# Patient Record
Sex: Female | Born: 1990 | Race: Black or African American | Hispanic: No | Marital: Single | Smoking: Never smoker
Health system: Southern US, Community
[De-identification: ages and names within clinical notes are randomized; demographics above are authoritative.]

## PROBLEM LIST (undated history)

## (undated) DIAGNOSIS — T7840XA Allergy, unspecified, initial encounter: Secondary | ICD-10-CM

## (undated) DIAGNOSIS — J45909 Unspecified asthma, uncomplicated: Secondary | ICD-10-CM

## (undated) DIAGNOSIS — F419 Anxiety disorder, unspecified: Secondary | ICD-10-CM

## (undated) DIAGNOSIS — R569 Unspecified convulsions: Secondary | ICD-10-CM

## (undated) DIAGNOSIS — R55 Syncope and collapse: Secondary | ICD-10-CM

## (undated) HISTORY — PX: TONSILLECTOMY: SUR1361

## (undated) HISTORY — DX: Allergy, unspecified, initial encounter: T78.40XA

---
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
## 2012-04-19 ENCOUNTER — Observation Stay: Payer: Self-pay | Admitting: Obstetrics and Gynecology

## 2012-05-09 ENCOUNTER — Inpatient Hospital Stay: Payer: Self-pay | Admitting: Obstetrics & Gynecology

## 2012-05-09 LAB — CBC WITH DIFFERENTIAL/PLATELET
Basophil %: 0.5 %
Eosinophil %: 2.6 %
HCT: 39.1 % (ref 35.0–47.0)
HGB: 12.2 g/dL (ref 12.0–16.0)
Lymphocyte #: 2.4 10*3/uL (ref 1.0–3.6)
Lymphocyte %: 19.2 %
MCH: 25.9 pg — ABNORMAL LOW (ref 26.0–34.0)
MCV: 83 fL (ref 80–100)
Monocyte #: 1.3 x10 3/mm — ABNORMAL HIGH (ref 0.2–0.9)
Monocyte %: 10.9 %
Neutrophil #: 8.2 10*3/uL — ABNORMAL HIGH (ref 1.4–6.5)
RBC: 4.69 10*6/uL (ref 3.80–5.20)
RDW: 16.6 % — ABNORMAL HIGH (ref 11.5–14.5)
WBC: 12.3 10*3/uL — ABNORMAL HIGH (ref 3.6–11.0)

## 2012-05-10 LAB — HEMATOCRIT: HCT: 35.6 % (ref 35.0–47.0)

## 2013-11-04 ENCOUNTER — Emergency Department: Payer: Self-pay | Admitting: Emergency Medicine

## 2013-11-19 ENCOUNTER — Emergency Department: Payer: Self-pay | Admitting: Emergency Medicine

## 2015-01-05 NOTE — H&P (Signed)
L&D Evaluation:  History Expanded:   HPI 24 yo G1 whose EDC = 05/10/12 presents in active labor    Gravida 1    Blood Type (Maternal) O positive    Group B Strep Results Maternal (Result >5wks must be treated as unknown) positive    Maternal HIV Negative    Maternal Syphilis Ab Nonreactive    Maternal Varicella Immune    Rubella Results (Maternal) immune    Presents with contractions    Patient's Medical History No Chronic Illness    Patient's Surgical History none    Medications Pre Natal Vitamins    Allergies NKDA    Social History none   ROS:   ROS All systems were reviewed.  HEENT, CNS, GI, GU, Respiratory, CV, Renal and Musculoskeletal systems were found to be normal.   Exam:   General other    Mental Status clear    Chest clear    Heart normal sinus rhythm    Abdomen gravid, non-tender    Estimated Fetal Weight Average for gestational age    Fetal Position Vtx    Back no CVAT    Reflexes 1+    Pelvic 4 cm    FHT normal rate with no decels   Impression:   Impression active labor   Electronic Signatures: Towana Badgerosenow, Philip J (MD)  (Signed 12-Sep-13 15:16)  Authored: L&D Evaluation   Last Updated: 12-Sep-13 15:16 by Towana Badgerosenow, Philip J (MD)

## 2015-02-12 DIAGNOSIS — N898 Other specified noninflammatory disorders of vagina: Secondary | ICD-10-CM | POA: Insufficient documentation

## 2015-06-19 ENCOUNTER — Emergency Department
Admission: EM | Admit: 2015-06-19 | Discharge: 2015-06-19 | Disposition: A | Discharge status: Home / Self Care | Payer: Managed Care, Other (non HMO) | Attending: Emergency Medicine | Admitting: Emergency Medicine

## 2015-06-19 DIAGNOSIS — M791 Myalgia, unspecified site: Secondary | ICD-10-CM

## 2015-06-19 DIAGNOSIS — Y92410 Unspecified street and highway as the place of occurrence of the external cause: Secondary | ICD-10-CM | POA: Insufficient documentation

## 2015-06-19 DIAGNOSIS — Y998 Other external cause status: Secondary | ICD-10-CM | POA: Insufficient documentation

## 2015-06-19 DIAGNOSIS — Y9389 Activity, other specified: Secondary | ICD-10-CM | POA: Insufficient documentation

## 2015-06-19 DIAGNOSIS — S199XXA Unspecified injury of neck, initial encounter: Secondary | ICD-10-CM | POA: Diagnosis present

## 2015-06-19 HISTORY — DX: Unspecified asthma, uncomplicated: J45.909
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
## 2016-07-21 DIAGNOSIS — J45909 Unspecified asthma, uncomplicated: Secondary | ICD-10-CM | POA: Insufficient documentation

## 2016-07-21 DIAGNOSIS — R55 Syncope and collapse: Secondary | ICD-10-CM | POA: Insufficient documentation

## 2016-07-21 LAB — CBC
HCT: 38.1 % (ref 35.0–47.0)
Hemoglobin: 12.5 g/dL (ref 12.0–16.0)
MCH: 28.1 pg (ref 26.0–34.0)
MCHC: 32.9 g/dL (ref 32.0–36.0)
MCV: 85.4 fL (ref 80.0–100.0)
PLATELETS: 193 10*3/uL (ref 150–440)
RBC: 4.46 MIL/uL (ref 3.80–5.20)
RDW: 14.6 % — ABNORMAL HIGH (ref 11.5–14.5)
WBC: 8.1 10*3/uL (ref 3.6–11.0)

## 2016-07-21 LAB — POCT PREGNANCY, URINE: Preg Test, Ur: NEGATIVE

## 2016-07-21 LAB — GLUCOSE, CAPILLARY: GLUCOSE-CAPILLARY: 83 mg/dL (ref 65–99)
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
## 2016-07-22 ENCOUNTER — Emergency Department
Admission: EM | Admit: 2016-07-22 | Discharge: 2016-07-22 | Disposition: A | Discharge status: Home / Self Care | Payer: Managed Care, Other (non HMO) | Attending: Emergency Medicine | Admitting: Emergency Medicine

## 2016-07-22 ENCOUNTER — Encounter: Payer: Self-pay | Admitting: Emergency Medicine

## 2016-07-22 DIAGNOSIS — R55 Syncope and collapse: Secondary | ICD-10-CM

## 2016-07-22 HISTORY — DX: Syncope and collapse: R55

## 2016-07-22 LAB — BASIC METABOLIC PANEL
Anion gap: 7 (ref 5–15)
BUN: 14 mg/dL (ref 6–20)
CHLORIDE: 107 mmol/L (ref 101–111)
CO2: 25 mmol/L (ref 22–32)
CREATININE: 0.7 mg/dL (ref 0.44–1.00)
Calcium: 9.4 mg/dL (ref 8.9–10.3)
GFR calc Af Amer: >60 mL/min (ref >60)
GFR calc non Af Amer: >60 mL/min (ref >60)
GLUCOSE: 92 mg/dL (ref 65–99)
POTASSIUM: 4.1 mmol/L (ref 3.5–5.1)
Sodium: 139 mmol/L (ref 135–145)

## 2016-07-22 LAB — URINALYSIS COMPLETE WITH MICROSCOPIC (ARMC ONLY)
BACTERIA UA: NONE SEEN
Bilirubin Urine: NEGATIVE
GLUCOSE, UA: NEGATIVE mg/dL
Nitrite: NEGATIVE
PROTEIN: 30 mg/dL — AB
Specific Gravity, Urine: 1.031 — ABNORMAL HIGH (ref 1.005–1.030)
pH: 5 (ref 5.0–8.0)

## 2016-07-22 MED ORDER — SODIUM CHLORIDE 0.9 % IV BOLUS (SEPSIS)
1000.0000 mL | INTRAVENOUS | Status: AC
  Administered 2016-07-22: 1000 mL via INTRAVENOUS

## 2016-07-22 NOTE — Discharge Instructions (Signed)
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
## 2016-07-24 ENCOUNTER — Telehealth: Payer: Self-pay

## 2016-07-24 NOTE — Telephone Encounter (Signed)
Lmov for patient to call back and schedule appointment for patient was seen in ED

## 2016-07-26 NOTE — Telephone Encounter (Signed)
Spoke to patient she is coming on 08/02/16

## 2016-08-02 ENCOUNTER — Other Ambulatory Visit: Payer: Self-pay

## 2016-08-02 ENCOUNTER — Ambulatory Visit (INDEPENDENT_AMBULATORY_CARE_PROVIDER_SITE_OTHER): Payer: Managed Care, Other (non HMO)

## 2016-08-02 ENCOUNTER — Encounter: Payer: Self-pay | Admitting: Internal Medicine

## 2016-08-02 ENCOUNTER — Other Ambulatory Visit (INDEPENDENT_AMBULATORY_CARE_PROVIDER_SITE_OTHER): Payer: Managed Care, Other (non HMO)

## 2016-08-02 ENCOUNTER — Other Ambulatory Visit: Payer: Self-pay | Admitting: Internal Medicine

## 2016-08-02 ENCOUNTER — Ambulatory Visit (INDEPENDENT_AMBULATORY_CARE_PROVIDER_SITE_OTHER): Payer: Managed Care, Other (non HMO) | Admitting: Internal Medicine

## 2016-08-02 VITALS — BP 105/68 | HR 71 | Ht 66.0 in | Wt 118.5 lb

## 2016-08-02 DIAGNOSIS — R55 Syncope and collapse: Secondary | ICD-10-CM

## 2016-08-02 DIAGNOSIS — R0602 Shortness of breath: Secondary | ICD-10-CM | POA: Diagnosis not present

## 2016-08-02 DIAGNOSIS — R002 Palpitations: Secondary | ICD-10-CM

## 2016-08-02 DIAGNOSIS — R079 Chest pain, unspecified: Secondary | ICD-10-CM

## 2016-08-02 LAB — ECHOCARDIOGRAM COMPLETE
CHL CUP STROKE VOLUME: 53 mL
E decel time: 264 msec
EERAT: 4.31
FS: 38 % (ref 28–44)
Height: 66 in
IVS/LV PW RATIO, ED: 0.84
LA ID, A-P, ES: 29 mm
LA vol index: 18.5 mL/m2
LA vol: 29.6 mL
LADIAMINDEX: 1.81 cm/m2
LAVOLA4C: 20 mL
LEFT ATRIUM END SYS DIAM: 29 mm
LV E/e'average: 4.31
LV PW d: 8.11 mm — AB (ref 0.6–1.1)
LV SIMPSON'S DISK: 76
LV TDI E'LATERAL: 19.3
LV dias vol index: 44 mL/m2
LV dias vol: 71 mL (ref 46–106)
LV sys vol index: 11 mL/m2
LVEEMED: 4.31
LVELAT: 19.3 cm/s
LVOT SV: 55 mL
LVOT VTI: 19.2 cm
LVOT area: 2.84 cm2
LVOT diameter: 19 mm
LVOT peak vel: 104 cm/s
LVSYSVOL: 17 mL (ref 14–42)
MV Dec: 264
MVPG: 3 mmHg
MVPKAVEL: 55.7 m/s
MVPKEVEL: 83.2 m/s
PV Reg vel dias: 78.3 cm/s
RV LATERAL S' VELOCITY: 13.1 cm/s
TAPSE: 24.9 mm
TDI e' medial: 12.1
Weight: 1896 oz

## 2016-08-02 NOTE — Progress Notes (Signed)
New Outpatient Visit Date: 08/02/2016  Referring Provider: Loleta Rose, MD [facility] Emergency Department  Chief Complaint: Passing out  HPI:  Ms. Warning is a 25 y.o. year-old female with history of asthma, who has been referred by Dr. York Cerise for evaluation of syncope. The patient reports multiple episodes of syncope as far back as middle school. These typically occur when standing for extended periods of time, though as a child, she also had several episodes while exerting herself. She frequently experiences a sharp pain in her chest followed by racing of the heart and shortness of breath. She states that it is painful and difficult to take a deep breath. After few minutes, she feels as though she is about to pass out. Frequently, she is able to lie down and abort the episode without losing consciousness. After 10-15 minutes, she feels normal again. Ms. Olivos experiences the palpitations and chest discomfort several times a week, but has only passed out completely 3 times over the last year.  Two weeks ago (Thanksgiving night), the patient also blacked out while out with friends. She was at a club and had consumed a single alcoholic beverage. Shortly after dancing, she was standing against a wall and unexpectedly fell over with brief loss of consciousness. She did not experience her typical prodromal symptoms. She believes that she came to after a few minutes and was helped outside. She was very sore on her left side, where she landed. She went home and slept well that night but continued to feel fatigued the following day with an accompanying headache. She was concerned that she may have struck her head and had a concussion. She proceeded to the [facility] ED, where evaluation was unremarkable. She has not had any further syncopal episodes since that time.  The patient denies a history of prior cardiovascular evaluation. She states that she stopped breathing while asleep as a child and was briefly on some  sort of breathing apparatus (? CPAP). The patient also reports that after some of her more recent syncopal episodes, she has experienced cramps in her hands and feet lasting a few minutes. She denies claudication, leg edema, orthopnea, and PND.  The patient does not drink alcohol regularly. She drinks 1 to 2 sodas per day, sometimes caffeinated. In the past, she has had considerable fluttering in her chest when drinking caffeinated energy drinks. She does not consume these regularly anymore.  --------------------------------------------------------------------------------------------------  Cardiovascular History & Procedures: Cardiovascular Problems:  Syncope  Risk Factors:  None  Cath/PCI:  None  CV Surgery:  None  EP Procedures and Devices:  None  Non-Invasive Evaluation(s):  None  Recent CV Pertinent Labs: Lab Results  Component Value Date   K 4.1 07/21/2016   BUN 14 07/21/2016   CREATININE 0.70 07/21/2016    --------------------------------------------------------------------------------------------------  Past Medical History:  Diagnosis Date  . Asthma   . Syncope and collapse     Past Surgical History:  Procedure Laterality Date  . TONSILLECTOMY      No outpatient encounter prescriptions on file as of 08/02/2016.   No facility-administered encounter medications on file as of 08/02/2016.     Allergies: Dust mite extract  Social History   Social History  . Marital status: Single    Spouse name: N/A  . Number of children: N/A  . Years of education: N/A   Occupational History  . Not on file.   Social History Main Topics  . Smoking status: Never Smoker  . Smokeless tobacco: Never Used  .  Alcohol use 0.6 oz/week    1 Shots of liquor per week     Comment: 3 mixed drinks per month  . Drug use: No  . Sexual activity: Not on file   Other Topics Concern  . Not on file   Social History Narrative  . No narrative on file    Family History    Problem Relation Age of Onset  . Anxiety disorder Mother   . Seizures Cousin   . Sudden Cardiac Death Neg Hx     Review of Systems: A 12-system review of systems was performed and was negative except as noted in the HPI.  --------------------------------------------------------------------------------------------------  Physical Exam: BP 105/68 (BP Location: Right Arm, Patient Position: Sitting, Cuff Size: Normal)   Pulse 71   Ht 5\' 6"  (1.676 m)   Wt 118 lb 8 oz (53.8 kg)   BMI 19.13 kg/m    Position Blood pressure (mmHg) Heart rate (bpm)  Lying 101/65 61  Sitting 99/68 64  Standing 90/62 79  Standing (3 minutes) 98/66 80   General:  Slender young woman, seated comfortably on the exam table. She is accompanied by her boyfriend. HEENT: No conjunctival pallor or scleral icterus.  Moist mucous membranes.  OP clear. Neck: Supple without lymphadenopathy, thyromegaly, JVD, or HJR.  No carotid bruit. Lungs: Normal work of breathing.  Clear to auscultation bilaterally without wheezes or crackles. Heart: Regular rate and rhythm without murmurs, rubs, or gallops.  Non-displaced PMI. Abd: Bowel sounds present.  Soft, NT/ND without hepatosplenomegaly Ext: No lower extremity edema.  Radial, PT, and DP pulses are 2+ bilaterally Skin: warm and dry without rash Neuro: CNIII-XII intact.  Strength and fine-touch sensation intact in upper and lower extremities bilaterally. Psych: Normal mood and affect.  EKG:  Normal sinus rhythm without significant abnormalities. Compared with prior tracing from 07/21/16, R-wave progression is now normal in the precordial leads (suspect prior EKG findings were due to lead placement). I have personally reviewed both tracings.  Lab Results  Component Value Date   WBC 8.1 07/21/2016   HGB 12.5 07/21/2016   HCT 38.1 07/21/2016   MCV 85.4 07/21/2016   PLT 193 07/21/2016    Lab Results  Component Value Date   NA 139 07/21/2016   K 4.1 07/21/2016   CL 107  07/21/2016   CO2 25 07/21/2016   BUN 14 07/21/2016   CREATININE 0.70 07/21/2016   GLUCOSE 92 07/21/2016   Pregnancy test (07/21/16): negative  --------------------------------------------------------------------------------------------------  ASSESSMENT AND PLAN: Recurrent syncope with accompanying palpitations, chest pain, and shortness of breath Many of the patient's episodes are most suggestive of a vasovagal mechanism or postural orthostatic tachycardia syndrome (POTS). However, her most recent episode that occurred without prodromal symptoms is somewhat concerning for an alternate etiology, as is the history of exercise induced dizziness/syncope. The patient otherwise does not have risk factors for cardiovascular disease, including no family history of unexplained syncope or death. Her EKG and physical exam today are normal. We have agreed to obtain a transthoracic echocardiogram and 14-day event monitor to evaluate for structural heart disease and further characterize potential arrhythmias. We will check a magnesium level and TSH as well. If this workup is negative, we will likely need to proceed with an exercise tolerance stress test. In the meantime, I have encouraged the patient to increase her fluid and salt intake, as well as to consider wearing compression stockings while at work. The patient was also counseled to avoid alcohol and caffeine.  Follow-up: Return  to clinic in 6 weeks.  Yvonne Kendallhristopher Toren Tucholski, MD 08/02/2016 1:16 PM

## 2016-08-02 NOTE — Patient Instructions (Addendum)
Medication Instructions:  Your physician recommends that you continue on your current medications as directed. Please refer to the Current Medication list given to you today.   Labwork: none  Testing/Procedures: Your physician has requested that you have an echocardiogram. Echocardiography is a painless test that uses sound waves to create images of your heart. It provides your doctor with information about the size and shape of your heart and how well your heart's chambers and valves are working. This procedure takes approximately one hour. There are no restrictions for this procedure.  Your physician has recommended that you wear an event monitor. Event monitors are medical devices that record the heart's electrical activity. Doctors most often us these monitors to diagnose arrhythmias. Arrhythmias are problems with the speed or rhythm of the heartbeat. The monitor is a small, portable device. You can wear one while you do your normal daily activities. This is usually used to diagnose what is causing palpitations/syncope (passing out).    Follow-Up: Your physician recommends that you schedule a follow-up appointment in: 6 weeks with Dr. Okey DupreEnd.    Any Other Special Instructions Will Be Listed Below (If Applicable).     If you need a refill on your cardiac medications before your next appointment, please call your pharmacy.  Echocardiogram An echocardiogram, or echocardiography, uses sound waves (ultrasound) to produce an image of your heart. The echocardiogram is simple, painless, obtained within a short period of time, and offers valuable information to your health care provider. The images from an echocardiogram can provide information such as:  Evidence of coronary artery disease (CAD).  Heart size.  Heart muscle function.  Heart valve function.  Aneurysm detection.  Evidence of a past heart attack.  Fluid buildup around the heart.  Heart muscle thickening.  Assess heart  valve function. Tell a health care provider about:  Any allergies you have.  All medicines you are taking, including vitamins, herbs, eye drops, creams, and over-the-counter medicines.  Any problems you or family members have had with anesthetic medicines.  Any blood disorders you have.  Any surgeries you have had.  Any medical conditions you have.  Whether you are pregnant or may be pregnant. What happens before the procedure? No special preparation is needed. Eat and drink normally. What happens during the procedure?  In order to produce an image of your heart, gel will be applied to your chest and a wand-like tool (transducer) will be moved over your chest. The gel will help transmit the sound waves from the transducer. The sound waves will harmlessly bounce off your heart to allow the heart images to be captured in real-time motion. These images will then be recorded.  You may need an IV to receive a medicine that improves the quality of the pictures. What happens after the procedure? You may return to your normal schedule including diet, activities, and medicines, unless your health care provider tells you otherwise. This information is not intended to replace advice given to you by your health care provider. Make sure you discuss any questions you have with your health care provider. Document Released: 08/11/2000 Document Revised: 04/01/2016 Document Reviewed: 04/21/2013 Elsevier Interactive Patient Education  2017 Elsevier Inc.  Cardiac Event Monitoring A cardiac event monitor is a small recording device used to help detect abnormal heart rhythms (arrhythmias). The monitor is used to record heart rhythm when noticeable symptoms such as the following occur:  Fast heartbeats (palpitations), such as heart racing or fluttering.  Dizziness.  Fainting or light-headedness.  Unexplained weakness. The monitor is wired to two electrodes placed on your chest. Electrodes are flat,  sticky disks that attach to your skin. The monitor can be worn for up to 30 days. You will wear the monitor at all times, except when bathing. How to use your cardiac event monitor A technician will prepare your chest for the electrode placement. The technician will show you how to place the electrodes, how to work the monitor, and how to replace the batteries. Take time to practice using the monitor before you leave the office. Make sure you understand how to send the information from the monitor to your health care provider. This requires a telephone with a landline, not a cell phone. You need to:  Wear your monitor at all times, except when you are in water:  Do not get the monitor wet.  Take the monitor off when bathing. Do not swim or use a hot tub with it on.  Keep your skin clean. Do not put body lotion or moisturizer on your chest.  Change the electrodes daily or any time they stop sticking to your skin. You might need to use tape to keep them on.  It is possible that your skin under the electrodes could become irritated. To keep this from happening, try to put the electrodes in slightly different places on your chest. However, they must remain in the area under your left breast and in the upper right section of your chest.  Make sure the monitor is safely clipped to your clothing or in a location close to your body that your health care provider recommends.  Press the button to record when you feel symptoms of heart trouble, such as dizziness, weakness, light-headedness, palpitations, thumping, shortness of breath, unexplained weakness, or a fluttering or racing heart. The monitor is always on and records what happened slightly before you pressed the button, so do not worry about being too late to get good information.  Keep a diary of your activities, such as walking, doing chores, and taking medicine. It is especially important to note what you were doing when you pushed the button to  record your symptoms. This will help your health care provider determine what might be contributing to your symptoms. The information stored in your monitor will be reviewed by your health care provider alongside your diary entries.  Send the recorded information as recommended by your health care provider. It is important to understand that it will take some time for your health care provider to process the results.  Change the batteries as recommended by your health care provider. Get help right away if:  You have chest pain.  You have extreme difficulty breathing or shortness of breath.  You develop a very fast heartbeat that persists.  You develop dizziness that does not go away.  You faint or constantly feel you are about to faint. This information is not intended to replace advice given to you by your health care provider. Make sure you discuss any questions you have with your health care provider. Document Released: 05/23/2008 Document Revised: 01/20/2016 Document Reviewed: 02/10/2013 Elsevier Interactive Patient Education  2017 [employer]

## 2016-08-04 ENCOUNTER — Ambulatory Visit (INDEPENDENT_AMBULATORY_CARE_PROVIDER_SITE_OTHER): Payer: Managed Care, Other (non HMO)

## 2016-08-04 ENCOUNTER — Other Ambulatory Visit: Payer: Self-pay

## 2016-08-04 DIAGNOSIS — R55 Syncope and collapse: Secondary | ICD-10-CM | POA: Diagnosis not present

## 2016-08-04 DIAGNOSIS — R002 Palpitations: Secondary | ICD-10-CM | POA: Diagnosis not present

## 2016-08-05 LAB — TSH: TSH: 0.423 u[IU]/mL — AB (ref 0.450–4.500)

## 2016-08-05 LAB — SPECIMEN STATUS REPORT

## 2016-08-07 ENCOUNTER — Other Ambulatory Visit: Payer: Self-pay

## 2016-08-07 ENCOUNTER — Telehealth: Payer: Self-pay | Admitting: Internal Medicine

## 2016-08-07 DIAGNOSIS — R002 Palpitations: Secondary | ICD-10-CM

## 2016-08-07 NOTE — Telephone Encounter (Signed)
Pt came into the office Friday, Dec 8, stating Zio Patch had come off.  New Zio was placed and pt instructed to mail first one back today. New information entered into [employer] and faxed to [phone] so that pt will not get charged twice.

## 2016-08-08 LAB — MAGNESIUM: MAGNESIUM: 1.9 mg/dL (ref 1.6–2.3)

## 2016-08-08 LAB — SPECIMEN STATUS REPORT

## 2016-08-09 ENCOUNTER — Encounter: Payer: Self-pay | Admitting: *Deleted

## 2016-08-19 LAB — MAGNESIUM: MAGNESIUM: 2.2 mg/dL (ref 1.6–2.3)

## 2016-08-19 LAB — SPECIMEN STATUS REPORT

## 2016-08-25 ENCOUNTER — Encounter: Payer: Self-pay | Admitting: *Deleted

## 2016-08-29 ENCOUNTER — Other Ambulatory Visit: Payer: Self-pay

## 2016-08-29 DIAGNOSIS — R002 Palpitations: Secondary | ICD-10-CM

## 2016-09-04 ENCOUNTER — Encounter: Payer: Self-pay | Admitting: *Deleted

## 2016-09-04 ENCOUNTER — Telehealth: Payer: Self-pay | Admitting: *Deleted

## 2016-09-04 NOTE — Telephone Encounter (Signed)
Patient did not show for exercise tolerance test today. No answer. Left message for patient to call back to reschedule.

## 2016-09-12 ENCOUNTER — Ambulatory Visit: Payer: Managed Care, Other (non HMO) | Admitting: Internal Medicine

## 2016-09-13 ENCOUNTER — Ambulatory Visit: Payer: Managed Care, Other (non HMO) | Admitting: Internal Medicine

## 2016-09-27 ENCOUNTER — Encounter: Payer: Self-pay | Admitting: Internal Medicine

## 2016-12-22 ENCOUNTER — Emergency Department
Admission: EM | Admit: 2016-12-22 | Discharge: 2016-12-23 | Disposition: A | Discharge status: Home / Self Care | Payer: Managed Care, Other (non HMO) | Attending: Emergency Medicine | Admitting: Emergency Medicine

## 2016-12-22 ENCOUNTER — Encounter: Payer: Self-pay | Admitting: Emergency Medicine

## 2016-12-22 DIAGNOSIS — F419 Anxiety disorder, unspecified: Secondary | ICD-10-CM | POA: Insufficient documentation

## 2016-12-22 DIAGNOSIS — F410 Panic disorder [episodic paroxysmal anxiety] without agoraphobia: Secondary | ICD-10-CM

## 2016-12-22 DIAGNOSIS — J45909 Unspecified asthma, uncomplicated: Secondary | ICD-10-CM | POA: Insufficient documentation

## 2016-12-22 DIAGNOSIS — R55 Syncope and collapse: Secondary | ICD-10-CM

## 2016-12-22 HISTORY — DX: Anxiety disorder, unspecified: F41.9

## 2016-12-22 NOTE — ED Notes (Signed)
Pt. States while at work tonight talking to a fellow co-worker, pt. States she started to feel hot and her heart rate was going up.  Pt. States she has had in the past and they usually go away in five minutes.  Pt. States tonight she might have had a syncope episode.  Pt. States HA and hurting at the back of her head.  No trauma noted to back of pt. Head.

## 2016-12-22 NOTE — ED Provider Notes (Signed)
[facility] Emergency Department Provider Note   ____________________________________________   First MD Initiated Contact with Patient 12/22/16 2332     (approximate)  I have reviewed the triage vital signs and the nursing notes.   HISTORY  Chief Complaint Anxiety    HPI Denise Lopez is a 26 y.o. female patient says she's had some panic attacks it was very small before felt hot but today she felt much hotter while talking to a coworker and then blacked out and woke up on the floor. She was coming into the emergency room she developed a headache posteriorly which is now fairly bad. This is unusual for her. Review of the patient's records show that she has a history of syncope in the past and has been evaluated by cardiology as well. Except for the headache patient feels well now she does not have chest pain she did not have any chest pain or palpitations that she remembers. She does not think she injured herself when she fell.   Past Medical History:  Diagnosis Date  . Anxiety   . Asthma   . Syncope and collapse     There are no active problems to display for this patient.   Past Surgical History:  Procedure Laterality Date  . TONSILLECTOMY      Prior to Admission medications   Not on File    Allergies Dust mite extract  Family History  Problem Relation Age of Onset  . Anxiety disorder Mother   . Seizures Cousin   . Sudden Cardiac Death Neg Hx     Social History Social History  Substance Use Topics  . Smoking status: Never Smoker  . Smokeless tobacco: Never Used  . Alcohol use 0.6 oz/week    1 Shots of liquor per week     Comment: 3 mixed drinks per month    Review of Systems  Constitutional: No fever/chills Eyes: No visual changes. ENT: No sore throat. Cardiovascular: Denies chest pain. Respiratory: Denies shortness of breath. Gastrointestinal: No abdominal pain.  No nausea, no vomiting.  No diarrhea.  No  constipation. Genitourinary: Negative for dysuria. Musculoskeletal: Negative for back pain. Skin: Negative for rash. Neurological: Negative for focal weakness or numbness.   ____________________________________________   PHYSICAL EXAM:  VITAL SIGNS: ED Triage Vitals  Enc Vitals Group     BP 12/22/16 2313 106/61     Pulse Rate 12/22/16 2313 82     Resp 12/22/16 2313 16     Temp 12/22/16 2313 97.6 F (36.4 C)     Temp Source 12/22/16 2313 Oral     SpO2 12/22/16 2313 100 %     Weight 12/22/16 2314 115 lb (52.2 kg)     Height 12/22/16 2314  (1.702 m)     Head Circumference --      Peak Flow --      Pain Score 12/22/16 2313 10     Pain Loc --      Pain Edu? --      Excl. in GC? --     Constitutional: Alert and oriented. Well appearing and in no acute distress. Eyes: Conjunctivae are normal. PERRL. EOMI. Head: Atraumatic. Nose: No congestion/rhinnorhea. Mouth/Throat: Mucous membranes are moist.  Oropharynx non-erythematous. Neck: No stridor.   Cardiovascular: Normal rate, regular rhythm. Grossly normal heart sounds.  Good peripheral circulation. Respiratory: Normal respiratory effort.  No retractions. Lungs CTAB. Gastrointestinal: Soft and nontender. No distention. No abdominal bruits. No CVA tenderness. }Musculoskeletal: No lower  extremity tenderness nor edema.  No joint effusions. Neurologic:  Normal speech and language. No gross focal neurologic deficits are appreciated.Cranial nerves II through XII are intact except for that visual fields were not checked cerebellar rapid alternating movements and finger to nose are normal motor strength is 5 over 5 throughout sensation is intact throughout. No gait instability. Skin:  Skin is warm, dry and intact. No rash noted. Psychiatric: Mood and affect are normal. Speech and behavior are normal.  ____________________________________________   LABS (all labs ordered are listed, but only abnormal results are displayed)  Labs  Reviewed  CBC WITH DIFFERENTIAL/PLATELET - Abnormal; Notable for the following:       Result Value   RDW 15.0 (*)    All other components within normal limits  URINALYSIS, COMPLETE (UACMP) WITH MICROSCOPIC - Abnormal; Notable for the following:    Color, Urine YELLOW (*)    APPearance CLEAR (*)    Leukocytes, UA TRACE (*)    Squamous Epithelial / LPF 0-5 (*)    All other components within normal limits  COMPREHENSIVE METABOLIC PANEL  TROPONIN I  PREGNANCY, URINE   ____________________________________________  EKG   ____________________________________________  RADIOLOGY  Study Result   CLINICAL DATA:  Syncope and headache at work. No head trauma. Headache and posterior head pain.  EXAM: CT HEAD WITHOUT CONTRAST  TECHNIQUE: Contiguous axial images were obtained from the base of the skull through the vertex without intravenous contrast.  COMPARISON:  None.  FINDINGS: BRAIN: No intraparenchymal hemorrhage, mass effect nor midline shift. The ventricles and sulci are normal. No acute large vascular territory infarcts. No abnormal extra-axial fluid collections. Basal cisterns are patent.  VASCULAR: Unremarkable.  SKULL/SOFT TISSUES: No skull fracture. No significant soft tissue swelling.  ORBITS/SINUSES: The included ocular globes and orbital contents are normal.The mastoid aircells and included paranasal sinuses are well-aerated. Pneumatized petrous apices.  OTHER: None.  IMPRESSION: Normal noncontrast CT HEAD.   Electronically Signed   By: Awilda Metro M.D.   On: 12/23/2016 01:57     ____________________________________________   PROCEDURES  Procedure(s) performed:  Procedures  Critical Care performed:   ____________________________________________   INITIAL IMPRESSION / ASSESSMENT AND PLAN / ED COURSE  Pertinent labs & imaging results that were available during my care of the patient were reviewed by me and considered in  my medical decision making (see chart for details).        ____________________________________________   FINAL CLINICAL IMPRESSION(S) / ED DIAGNOSES  Final diagnoses:  Anxiety attack  Syncope, unspecified syncope type      NEW MEDICATIONS STARTED DURING THIS VISIT:  New Prescriptions   No medications on file     Note:  This document was prepared using Dragon voice recognition software and may include unintentional dictation errors.    Arnaldo Natal, MD 12/23/16 [id number]

## 2016-12-22 NOTE — ED Triage Notes (Signed)
Pt. States "while at work tonight pt. States she experienced an anxiety attack while talking to another co-worker tonight.  Pt. Unsure how it started, Pt. States "I have had mini-anxiety attacks in the past, but this one lasted longer".

## 2016-12-23 ENCOUNTER — Emergency Department: Payer: Managed Care, Other (non HMO)

## 2016-12-23 LAB — CBC WITH DIFFERENTIAL/PLATELET
BASOS PCT: 1 %
Basophils Absolute: 0.1 10*3/uL (ref 0–0.1)
EOS ABS: 0.1 10*3/uL (ref 0–0.7)
Eosinophils Relative: 1 %
HEMATOCRIT: 39.9 % (ref 35.0–47.0)
Hemoglobin: 13 g/dL (ref 12.0–16.0)
Lymphocytes Relative: 22 %
Lymphs Abs: 2 10*3/uL (ref 1.0–3.6)
MCH: 27.7 pg (ref 26.0–34.0)
MCHC: 32.5 g/dL (ref 32.0–36.0)
MCV: 85.3 fL (ref 80.0–100.0)
MONO ABS: 0.8 10*3/uL (ref 0.2–0.9)
MONOS PCT: 9 %
NEUTROS ABS: 6 10*3/uL (ref 1.4–6.5)
Neutrophils Relative %: 67 %
Platelets: 216 10*3/uL (ref 150–440)
RBC: 4.68 MIL/uL (ref 3.80–5.20)
RDW: 15 % — ABNORMAL HIGH (ref 11.5–14.5)
WBC: 8.8 10*3/uL (ref 3.6–11.0)

## 2016-12-23 LAB — URINALYSIS, COMPLETE (UACMP) WITH MICROSCOPIC
BILIRUBIN URINE: NEGATIVE
Bacteria, UA: NONE SEEN
GLUCOSE, UA: NEGATIVE mg/dL
HGB URINE DIPSTICK: NEGATIVE
Ketones, ur: NEGATIVE mg/dL
Nitrite: NEGATIVE
PH: 6 (ref 5.0–8.0)
PROTEIN: NEGATIVE mg/dL
Specific Gravity, Urine: 1.021 (ref 1.005–1.030)

## 2016-12-23 LAB — COMPREHENSIVE METABOLIC PANEL
ALBUMIN: 4.3 g/dL (ref 3.5–5.0)
ALK PHOS: 48 U/L (ref 38–126)
ALT: 37 U/L (ref 14–54)
AST: 31 U/L (ref 15–41)
Anion gap: 7 (ref 5–15)
BILIRUBIN TOTAL: 1 mg/dL (ref 0.3–1.2)
BUN: 14 mg/dL (ref 6–20)
CALCIUM: 9.2 mg/dL (ref 8.9–10.3)
CO2: 25 mmol/L (ref 22–32)
CREATININE: 0.62 mg/dL (ref 0.44–1.00)
Chloride: 103 mmol/L (ref 101–111)
GFR calc Af Amer: >60 mL/min (ref >60)
GFR calc non Af Amer: >60 mL/min (ref >60)
GLUCOSE: 82 mg/dL (ref 65–99)
Potassium: 3.8 mmol/L (ref 3.5–5.1)
Sodium: 135 mmol/L (ref 135–145)
TOTAL PROTEIN: 7.3 g/dL (ref 6.5–8.1)

## 2016-12-23 LAB — PREGNANCY, URINE: Preg Test, Ur: NEGATIVE

## 2016-12-23 LAB — TROPONIN I: Troponin I: <0.03 ng/mL (ref <0.03)

## 2016-12-23 MED ORDER — ACETAMINOPHEN 500 MG PO TABS
1000.0000 mg | ORAL_TABLET | Freq: Once | ORAL | Status: AC
  Administered 2016-12-23: 1000 mg via ORAL

## 2016-12-23 MED ORDER — ACETAMINOPHEN 500 MG PO TABS
ORAL_TABLET | ORAL | Status: AC
Start: 2016-12-23 — End: 2016-12-23
  Administered 2016-12-23: 1000 mg via ORAL
  Filled 2016-12-23: qty 2

## 2016-12-23 NOTE — ED Notes (Signed)
Patient transported to CT 

## 2016-12-23 NOTE — ED Notes (Signed)
Pt. Going home with family. 

## 2016-12-23 NOTE — ED Notes (Signed)
Pt. Returned from CT.

## 2016-12-23 NOTE — Discharge Instructions (Signed)
Please follow-up with your regular doctor or see [facility] clinic acute-care for further treatment and evaluation of your anxiety. Please return for any further problems.

## 2017-04-26 ENCOUNTER — Encounter: Payer: Self-pay | Admitting: Emergency Medicine

## 2017-04-26 ENCOUNTER — Emergency Department
Admission: EM | Admit: 2017-04-26 | Discharge: 2017-04-26 | Disposition: A | Discharge status: Home / Self Care | Payer: Commercial Managed Care - PPO | Attending: Emergency Medicine | Admitting: Emergency Medicine

## 2017-04-26 ENCOUNTER — Emergency Department: Payer: Commercial Managed Care - PPO

## 2017-04-26 DIAGNOSIS — B9689 Other specified bacterial agents as the cause of diseases classified elsewhere: Secondary | ICD-10-CM

## 2017-04-26 DIAGNOSIS — N760 Acute vaginitis: Secondary | ICD-10-CM | POA: Diagnosis not present

## 2017-04-26 DIAGNOSIS — N939 Abnormal uterine and vaginal bleeding, unspecified: Secondary | ICD-10-CM | POA: Diagnosis not present

## 2017-04-26 DIAGNOSIS — J45909 Unspecified asthma, uncomplicated: Secondary | ICD-10-CM | POA: Insufficient documentation

## 2017-04-26 DIAGNOSIS — R58 Hemorrhage, not elsewhere classified: Secondary | ICD-10-CM

## 2017-04-26 LAB — WET PREP, GENITAL
Sperm: NONE SEEN
Trich, Wet Prep: NONE SEEN
YEAST WET PREP: NONE SEEN

## 2017-04-26 LAB — COMPREHENSIVE METABOLIC PANEL
ALBUMIN: 4 g/dL (ref 3.5–5.0)
ALT: 18 U/L (ref 14–54)
ANION GAP: 5 (ref 5–15)
AST: 19 U/L (ref 15–41)
Alkaline Phosphatase: 41 U/L (ref 38–126)
BUN: 15 mg/dL (ref 6–20)
CHLORIDE: 108 mmol/L (ref 101–111)
CO2: 25 mmol/L (ref 22–32)
Calcium: 8.9 mg/dL (ref 8.9–10.3)
Creatinine, Ser: 0.76 mg/dL (ref 0.44–1.00)
GFR calc Af Amer: >60 mL/min (ref >60)
Glucose, Bld: 98 mg/dL (ref 65–99)
POTASSIUM: 3.9 mmol/L (ref 3.5–5.1)
Sodium: 138 mmol/L (ref 135–145)
Total Bilirubin: 0.6 mg/dL (ref 0.3–1.2)
Total Protein: 7.1 g/dL (ref 6.5–8.1)

## 2017-04-26 LAB — POCT PREGNANCY, URINE: PREG TEST UR: NEGATIVE

## 2017-04-26 LAB — CHLAMYDIA/NGC RT PCR (ARMC ONLY)
Chlamydia Tr: NOT DETECTED
N GONORRHOEAE: NOT DETECTED

## 2017-04-26 LAB — CBC WITH DIFFERENTIAL/PLATELET
Basophils Absolute: 0.1 10*3/uL (ref 0–0.1)
Basophils Relative: 1 %
EOS PCT: 2 %
Eosinophils Absolute: 0.1 10*3/uL (ref 0–0.7)
HEMATOCRIT: 35.9 % (ref 35.0–47.0)
Hemoglobin: 11.8 g/dL — ABNORMAL LOW (ref 12.0–16.0)
LYMPHS ABS: 2.6 10*3/uL (ref 1.0–3.6)
LYMPHS PCT: 37 %
MCH: 27.8 pg (ref 26.0–34.0)
MCHC: 32.9 g/dL (ref 32.0–36.0)
MCV: 84.4 fL (ref 80.0–100.0)
MONO ABS: 0.6 10*3/uL (ref 0.2–0.9)
Monocytes Relative: 9 %
Neutro Abs: 3.5 10*3/uL (ref 1.4–6.5)
Neutrophils Relative %: 51 %
PLATELETS: 206 10*3/uL (ref 150–440)
RBC: 4.26 MIL/uL (ref 3.80–5.20)
RDW: 15.4 % — AB (ref 11.5–14.5)
WBC: 6.9 10*3/uL (ref 3.6–11.0)

## 2017-04-26 LAB — TYPE AND SCREEN
ABO/RH(D): O POS
ANTIBODY SCREEN: NEGATIVE

## 2017-04-26 LAB — PREGNANCY, URINE: PREG TEST UR: NEGATIVE

## 2017-04-26 MED ORDER — METRONIDAZOLE 500 MG PO TABS: 500.0000 mg | ORAL_TABLET | Freq: Two times a day (BID) | ORAL | 0 refills | Status: AC

## 2017-04-26 NOTE — ED Provider Notes (Signed)
[facility] Emergency Department Provider Note   ____________________________________________   First MD Initiated Contact with Patient 04/26/17 0710     (approximate)  I have reviewed the triage vital signs and the nursing notes.   HISTORY  Chief Complaint Vaginal Bleeding   HPI Denise Lopez is a 26 y.o. female he reports daily bleeding for 2 months. Sometimes very heavy although today it was not it was light today and she does not complain of any lightheadedness or weakness just the bleeding. She also reports a bad odor.patient does not have any pain.   Past Medical History:  Diagnosis Date  . Anxiety   . Asthma   . Syncope and collapse     There are no active problems to display for this patient.   Past Surgical History:  Procedure Laterality Date  . TONSILLECTOMY      Prior to Admission medications   Not on File    Allergies Dust mite extract  Family History  Problem Relation Age of Onset  . Anxiety disorder Mother   . Seizures Cousin   . Sudden Cardiac Death Neg Hx     Social History Social History  Substance Use Topics  . Smoking status: Never Smoker  . Smokeless tobacco: Never Used  . Alcohol use 0.6 oz/week    1 Shots of liquor per week     Comment: 3 mixed drinks per month    Review of Systems  Constitutional: No fever/chills Eyes: No visual changes. ENT: No sore throat. Cardiovascular: Denies chest pain. Respiratory: Denies shortness of breath. Gastrointestinal: No abdominal pain.  No nausea, no vomiting.  No diarrhea.  No constipation. Genitourinary: Negative for dysuria. Musculoskeletal: Negative for back pain. Skin: Negative for rash. Neurological: Negative for headaches, focal weakness   ____________________________________________   PHYSICAL EXAM:  VITAL SIGNS: ED Triage Vitals  Enc Vitals Group     BP 04/26/17 0613 108/73     Pulse Rate 04/26/17 0613 74     Resp 04/26/17 0613 18     Temp  04/26/17 0613 98.2 F (36.8 C)     Temp Source 04/26/17 0613 Oral     SpO2 04/26/17 0613 100 %     Weight 04/26/17 0612 120 lb (54.4 kg)     Height 04/26/17 0612 5\' 7"  (1.702 m)     Head Circumference --      Peak Flow --      Pain Score --      Pain Loc --      Pain Edu? --      Excl. in GC? --     Constitutional: Alert and oriented. Well appearing and in no acute distress. Eyes: Conjunctivae are normal.  Head: Atraumatic. Nose: No congestion/rhinnorhea. Mouth/Throat: Mucous membranes are moist.  Oropharynx non-erythematous. Neck: No stridor.   Cardiovascular: Normal rate, regular rhythm. Grossly normal heart sounds.  Good peripheral circulation. Respiratory: Normal respiratory effort.  No retractions. Lungs CTAB. Gastrointestinal: Soft and nontender. No distention. No abdominal bruits. No CVA tenderness. Genitourinary: normal perineum and vagina. Cervix is closed. There is no cervical motion tenderness no adnexal masses no uterine enlargement and currently no bleeding. *Musculoskeletal: No lower extremity tenderness nor edema.  No joint effusions. Neurologic:  Normal speech and language. No gross focal neurologic deficits are appreciated. Skin:  Skin is warm, dry and intact. No rash noted. Psychiatric: Mood and affect are normal. Speech and behavior are normal.  ____________________________________________   LABS (all labs ordered are listed,  but only abnormal results are displayed)  Labs Reviewed  CBC WITH DIFFERENTIAL/PLATELET - Abnormal; Notable for the following:       Result Value   Hemoglobin 11.8 (*)    RDW 15.4 (*)    All other components within normal limits  COMPREHENSIVE METABOLIC PANEL  TYPE AND SCREEN   ____________________________________________  EKG   ____________________________________________  RADIOLOGY  No results found.  ____________________________________________   PROCEDURES  Procedure(s) performed:   Procedures  Critical Care  performed:  ____________________________________________   INITIAL IMPRESSION / ASSESSMENT AND PLAN / ED COURSE  Pertinent labs & imaging results that were available during my care of the patient were reviewed by me and considered in my medical decision making (see chart for details).       ____________________________________________   FINAL CLINICAL IMPRESSION(S) / ED DIAGNOSES  Final diagnoses:  None      NEW MEDICATIONS STARTED DURING THIS VISIT:  New Prescriptions   No medications on file     Note:  This document was prepared using Dragon voice recognition software and may include unintentional dictation errors.    Arnaldo Natal, MD 04/26/17 [phone]

## 2017-04-26 NOTE — ED Triage Notes (Signed)
Patient ambulatory to triage with steady gait, without difficulty or distress noted; pt st here for vag bleeding x 2months; denies any pain or hx of same

## 2017-04-26 NOTE — Discharge Instructions (Signed)
Please call Dr. Dalbert GarnetBeasley the GYN doctor. She will figure out how to get your bleeding back to normal. I will also give you some antibiotics and Flagyl one pill 2 times a day to take care of the bacterial vaginosis that you have. That's probably what is causing the odor.

## 2017-04-26 NOTE — ED Notes (Signed)
Call light answered. Pt wanted to make us aware she had a urine sample for us. Urine labeled and POC performed. UA sent to lab.

## 2018-01-29 LAB — HM PAP SMEAR: HM Pap smear: NEGATIVE

## 2018-04-12 ENCOUNTER — Inpatient Hospital Stay
Admission: EM | Admit: 2018-04-12 | Discharge: 2018-04-13 | DRG: 101 | Disposition: A | Discharge status: Home / Self Care | Payer: Commercial Managed Care - PPO | Attending: Internal Medicine | Admitting: Internal Medicine

## 2018-04-12 ENCOUNTER — Emergency Department: Payer: Commercial Managed Care - PPO

## 2018-04-12 ENCOUNTER — Other Ambulatory Visit: Payer: Self-pay

## 2018-04-12 DIAGNOSIS — Z91048 Other nonmedicinal substance allergy status: Secondary | ICD-10-CM | POA: Diagnosis not present

## 2018-04-12 DIAGNOSIS — E876 Hypokalemia: Secondary | ICD-10-CM | POA: Diagnosis present

## 2018-04-12 DIAGNOSIS — J45909 Unspecified asthma, uncomplicated: Secondary | ICD-10-CM | POA: Diagnosis present

## 2018-04-12 DIAGNOSIS — R569 Unspecified convulsions: Principal | ICD-10-CM

## 2018-04-12 DIAGNOSIS — D72829 Elevated white blood cell count, unspecified: Secondary | ICD-10-CM | POA: Diagnosis present

## 2018-04-12 LAB — URINE DRUG SCREEN, QUALITATIVE (ARMC ONLY)
AMPHETAMINES, UR SCREEN: NOT DETECTED
Barbiturates, Ur Screen: NOT DETECTED
COCAINE METABOLITE, UR ~~LOC~~: NOT DETECTED
Cannabinoid 50 Ng, Ur ~~LOC~~: POSITIVE — AB
MDMA (Ecstasy)Ur Screen: NOT DETECTED
METHADONE SCREEN, URINE: NOT DETECTED
OPIATE, UR SCREEN: NOT DETECTED
PHENCYCLIDINE (PCP) UR S: NOT DETECTED
Tricyclic, Ur Screen: NOT DETECTED

## 2018-04-12 LAB — CBC WITH DIFFERENTIAL/PLATELET
BASOS PCT: 0 %
Basophils Absolute: 0 10*3/uL (ref 0–0.1)
EOS PCT: 1 %
Eosinophils Absolute: 0.1 10*3/uL (ref 0–0.7)
HCT: 37.4 % (ref 35.0–47.0)
HEMOGLOBIN: 12.2 g/dL (ref 12.0–16.0)
Lymphocytes Relative: 11 %
Lymphs Abs: 1.6 10*3/uL (ref 1.0–3.6)
MCH: 28.1 pg (ref 26.0–34.0)
MCHC: 32.6 g/dL (ref 32.0–36.0)
MCV: 86.3 fL (ref 80.0–100.0)
Monocytes Absolute: 1 10*3/uL — ABNORMAL HIGH (ref 0.2–0.9)
Monocytes Relative: 7 %
NEUTROS PCT: 81 %
Neutro Abs: 11.9 10*3/uL — ABNORMAL HIGH (ref 1.4–6.5)
PLATELETS: 221 10*3/uL (ref 150–440)
RBC: 4.34 MIL/uL (ref 3.80–5.20)
RDW: 14.6 % — ABNORMAL HIGH (ref 11.5–14.5)
WBC: 14.6 10*3/uL — AB (ref 3.6–11.0)

## 2018-04-12 LAB — COMPREHENSIVE METABOLIC PANEL
ALBUMIN: 3.4 g/dL — AB (ref 3.5–5.0)
ALT: 15 U/L (ref 0–44)
ANION GAP: 8 (ref 5–15)
AST: 25 U/L (ref 15–41)
Alkaline Phosphatase: 55 U/L (ref 38–126)
BUN: 10 mg/dL (ref 6–20)
CALCIUM: 8.4 mg/dL — AB (ref 8.9–10.3)
CHLORIDE: 107 mmol/L (ref 98–111)
CO2: 22 mmol/L (ref 22–32)
CREATININE: 0.84 mg/dL (ref 0.44–1.00)
GFR calc Af Amer: >60 mL/min (ref >60)
GFR calc non Af Amer: >60 mL/min (ref >60)
GLUCOSE: 130 mg/dL — AB (ref 70–99)
Potassium: 3 mmol/L — ABNORMAL LOW (ref 3.5–5.1)
Sodium: 137 mmol/L (ref 135–145)
Total Bilirubin: 0.8 mg/dL (ref 0.3–1.2)
Total Protein: 6.6 g/dL (ref 6.5–8.1)

## 2018-04-12 LAB — URINALYSIS, COMPLETE (UACMP) WITH MICROSCOPIC
BILIRUBIN URINE: NEGATIVE
Glucose, UA: NEGATIVE mg/dL
KETONES UR: 5 mg/dL — AB
LEUKOCYTES UA: NEGATIVE
Nitrite: NEGATIVE
PROTEIN: 30 mg/dL — AB
Specific Gravity, Urine: 1.024 (ref 1.005–1.030)
pH: 5 (ref 5.0–8.0)

## 2018-04-12 LAB — POCT PREGNANCY, URINE: PREG TEST UR: NEGATIVE

## 2018-04-12 LAB — MAGNESIUM: Magnesium: 2 mg/dL (ref 1.7–2.4)

## 2018-04-12 MED ORDER — POTASSIUM CHLORIDE CRYS ER 20 MEQ PO TBCR
40.0000 meq | EXTENDED_RELEASE_TABLET | Freq: Two times a day (BID) | ORAL | Status: AC
  Administered 2018-04-12 – 2018-04-13 (×2): 40 meq via ORAL
  Filled 2018-04-12 (×2): qty 2

## 2018-04-12 MED ORDER — LORAZEPAM 2 MG/ML IJ SOLN: 1.0000 mg | INTRAMUSCULAR | Status: DC | PRN

## 2018-04-12 MED ORDER — ONDANSETRON HCL 4 MG/2ML IJ SOLN: 4.0000 mg | Freq: Four times a day (QID) | INTRAMUSCULAR | Status: DC | PRN

## 2018-04-12 MED ORDER — ENOXAPARIN SODIUM 40 MG/0.4ML ~~LOC~~ SOLN
40.0000 mg | SUBCUTANEOUS | Status: DC
  Administered 2018-04-12: 23:00:00 40 mg via SUBCUTANEOUS
  Filled 2018-04-12: qty 0.4

## 2018-04-12 MED ORDER — ACETAMINOPHEN 325 MG PO TABS
650.0000 mg | ORAL_TABLET | Freq: Four times a day (QID) | ORAL | Status: DC | PRN
  Filled 2018-04-12: qty 2

## 2018-04-12 MED ORDER — ALBUTEROL SULFATE (2.5 MG/3ML) 0.083% IN NEBU: 2.5000 mg | INHALATION_SOLUTION | RESPIRATORY_TRACT | Status: DC | PRN

## 2018-04-12 MED ORDER — ACETAMINOPHEN 650 MG RE SUPP: 650.0000 mg | Freq: Four times a day (QID) | RECTAL | Status: DC | PRN

## 2018-04-12 MED ORDER — ONDANSETRON HCL 4 MG/2ML IJ SOLN
4.0000 mg | Freq: Once | INTRAMUSCULAR | Status: AC
  Administered 2018-04-12: 4 mg via INTRAVENOUS
  Filled 2018-04-12: qty 2

## 2018-04-12 MED ORDER — ONDANSETRON HCL 4 MG PO TABS: 4.0000 mg | ORAL_TABLET | Freq: Four times a day (QID) | ORAL | Status: DC | PRN

## 2018-04-12 MED ORDER — KETOROLAC TROMETHAMINE 30 MG/ML IJ SOLN
30.0000 mg | Freq: Once | INTRAMUSCULAR | Status: AC
  Administered 2018-04-13: 30 mg via INTRAVENOUS
  Filled 2018-04-12: qty 1

## 2018-04-12 MED ORDER — SODIUM CHLORIDE 0.9% FLUSH
3.0000 mL | Freq: Two times a day (BID) | INTRAVENOUS | Status: DC
  Administered 2018-04-12 – 2018-04-13 (×2): 3 mL via INTRAVENOUS

## 2018-04-12 MED ORDER — SODIUM CHLORIDE 0.9% FLUSH: 3.0000 mL | INTRAVENOUS | Status: DC | PRN

## 2018-04-12 MED ORDER — SENNOSIDES-DOCUSATE SODIUM 8.6-50 MG PO TABS: 1.0000 | ORAL_TABLET | Freq: Every evening | ORAL | Status: DC | PRN

## 2018-04-12 MED ORDER — SODIUM CHLORIDE 0.9 % IV SOLN: 250.0000 mL | INTRAVENOUS | Status: DC | PRN

## 2018-04-12 NOTE — ED Notes (Signed)
RN aware of bed assigned 

## 2018-04-12 NOTE — ED Notes (Signed)
Dr. Imogene Burnhen at bedside. Made aware of pt's BP of 94/56 mmHg. States this is okay for pt. No verbal orders given.

## 2018-04-12 NOTE — H&P (Signed)
[employer] [facility] [place] [facility]   PATIENT NAME: Denise Lopez    MR#:  [id number]  DATE OF BIRTH:  [birth year]  DATE OF ADMISSION:  04/12/2018  PRIMARY CARE PHYSICIAN: Patient, No Pcp Per   REQUESTING/REFERRING PHYSICIAN: Dr. Pershing Proud.  CHIEF COMPLAINT:   Chief Complaint  Patient presents with  . Seizures   Seizure today. HISTORY OF PRESENT ILLNESS:  Denise Lopez  is a 27 y.o. female with a known history of seizure, asthma, anxiety and syncope.  The patient was sent by EMS due to witnessed seizure by her sister today.  Per EMS, a total of 2 minutes of generalized tonic-chronic body shaking.  The patient was confused afterwards.  She complains of headache and dizziness and muscle aching but denies any symptoms..  The patient has not eaten for several days due to vomiting.  The patient denies any incontinence.  She has a similar seizure activity before but never see neurologist.  PAST MEDICAL HISTORY:   Past Medical History:  Diagnosis Date  . Anxiety   . Asthma   . Syncope and collapse     PAST SURGICAL HISTORY:   Past Surgical History:  Procedure Laterality Date  . TONSILLECTOMY      SOCIAL HISTORY:   Social History   Tobacco Use  . Smoking status: Never Smoker  . Smokeless tobacco: Never Used  Substance Use Topics  . Alcohol use: Yes    Alcohol/week: 1.0 standard drinks    Types: 1 Shots of liquor per week    Comment: 3 mixed drinks per month    FAMILY HISTORY:   Family History  Problem Relation Age of Onset  . Anxiety disorder Mother   . Seizures Cousin   . Sudden Cardiac Death Neg Hx     DRUG ALLERGIES:   Allergies  Allergen Reactions  . Dust Mite Extract     REVIEW OF SYSTEMS:   Review of Systems  Constitutional: Positive for malaise/fatigue. Negative for chills and fever.  HENT: Negative for sore throat.   Eyes: Negative for blurred vision and double vision.  Respiratory: Negative for cough, hemoptysis,  shortness of breath, wheezing and stridor.   Cardiovascular: Negative for chest pain, palpitations, orthopnea and leg swelling.  Gastrointestinal: Negative for abdominal pain, blood in stool, diarrhea, melena, nausea and vomiting.  Genitourinary: Negative for dysuria, flank pain and hematuria.  Musculoskeletal: Positive for myalgias. Negative for back pain and joint pain.  Skin: Negative for rash.  Neurological: Positive for dizziness, seizures, loss of consciousness and headaches. Negative for sensory change, focal weakness and weakness.  Endo/Heme/Allergies: Negative for polydipsia.  Psychiatric/Behavioral: Negative for depression. The patient is not nervous/anxious.     MEDICATIONS AT HOME:   Prior to Admission medications   Not on File      VITAL SIGNS:  Blood pressure (!) 94/56, pulse 72, temperature 98.5 F (36.9 C), temperature source Oral, resp. rate 12, height 5\' 9"  (1.753 m), weight 61.2 kg, SpO2 99 %.  PHYSICAL EXAMINATION:  Physical Exam  GENERAL:  27 y.o.-year-old patient lying in the bed with no acute distress.  EYES: Pupils equal, round, reactive to light and accommodation. No scleral icterus. Extraocular muscles intact.  HEENT: Head atraumatic, normocephalic. Oropharynx and nasopharynx clear.  NECK:  Supple, no jugular venous distention. No thyroid enlargement, no tenderness.  LUNGS: Normal breath sounds bilaterally, no wheezing, rales,rhonchi or crepitation. No use of accessory muscles of respiration.  CARDIOVASCULAR: S1, S2 normal. No murmurs, rubs, or gallops.  ABDOMEN: Soft, nontender, nondistended. Bowel sounds present. No organomegaly or mass.  EXTREMITIES: No pedal edema, cyanosis, or clubbing.  NEUROLOGIC: Cranial nerves II through XII are intact. Muscle strength 5/5 in all extremities. Sensation intact. Gait not checked.  PSYCHIATRIC: The patient is alert and oriented x 3.  SKIN: No obvious rash, lesion, or ulcer.   LABORATORY PANEL:   CBC Recent Labs    Lab 04/12/18 1357  WBC 14.6*  HGB 12.2  HCT 37.4  PLT 221   ------------------------------------------------------------------------------------------------------------------  Chemistries  Recent Labs  Lab 04/12/18 1357  NA 137  K 3.0*  CL 107  CO2 22  GLUCOSE 130*  BUN 10  CREATININE 0.84  CALCIUM 8.4*  AST 25  ALT 15  ALKPHOS 55  BILITOT 0.8   ------------------------------------------------------------------------------------------------------------------  Cardiac Enzymes No results for input(s): TROPONINI in the last 168 hours. ------------------------------------------------------------------------------------------------------------------  RADIOLOGY:  Ct Head Wo Contrast  Result Date: 04/12/2018 CLINICAL DATA:  Witnessed seizure 2 minutes period headache and right-sided pain. EXAM: CT HEAD WITHOUT CONTRAST CT CERVICAL SPINE WITHOUT CONTRAST TECHNIQUE: Multidetector CT imaging of the head and cervical spine was performed following the standard protocol without intravenous contrast. Multiplanar CT image reconstructions of the cervical spine were also generated. COMPARISON:  12/23/2016 FINDINGS: CT HEAD FINDINGS Brain: The brainstem, cerebellum, cerebral peduncles, thalami, basal ganglia, basilar cisterns, and ventricular system appear within normal limits. No intracranial hemorrhage, mass lesion, or acute CVA. Vascular: Unremarkable Skull: Unremarkable Sinuses/Orbits: Mild chronic ethmoid and sphenoid sinusitis. Other: No supplemental non-categorized findings. CT CERVICAL SPINE FINDINGS Alignment: No vertebral subluxation is observed. Skull base and vertebrae: No fracture or acute bony findings. Soft tissues and spinal canal: Scattered small internal jugular lymph nodes are likely incidental. Disc levels:  No osseous impingement. Upper chest: Unremarkable Other: No supplemental non-categorized findings. IMPRESSION: 1. No significant intracranial abnormality and no significant  cervical spine abnormality. 2. Mild chronic ethmoid and sphenoid sinusitis. Electronically Signed   By: Gaylyn RongWalter  Liebkemann M.D.   On: 04/12/2018 17:22   Ct Cervical Spine Wo Contrast  Result Date: 04/12/2018 CLINICAL DATA:  Witnessed seizure 2 minutes period headache and right-sided pain. EXAM: CT HEAD WITHOUT CONTRAST CT CERVICAL SPINE WITHOUT CONTRAST TECHNIQUE: Multidetector CT imaging of the head and cervical spine was performed following the standard protocol without intravenous contrast. Multiplanar CT image reconstructions of the cervical spine were also generated. COMPARISON:  12/23/2016 FINDINGS: CT HEAD FINDINGS Brain: The brainstem, cerebellum, cerebral peduncles, thalami, basal ganglia, basilar cisterns, and ventricular system appear within normal limits. No intracranial hemorrhage, mass lesion, or acute CVA. Vascular: Unremarkable Skull: Unremarkable Sinuses/Orbits: Mild chronic ethmoid and sphenoid sinusitis. Other: No supplemental non-categorized findings. CT CERVICAL SPINE FINDINGS Alignment: No vertebral subluxation is observed. Skull base and vertebrae: No fracture or acute bony findings. Soft tissues and spinal canal: Scattered small internal jugular lymph nodes are likely incidental. Disc levels:  No osseous impingement. Upper chest: Unremarkable Other: No supplemental non-categorized findings. IMPRESSION: 1. No significant intracranial abnormality and no significant cervical spine abnormality. 2. Mild chronic ethmoid and sphenoid sinusitis. Electronically Signed   By: Gaylyn RongWalter  Liebkemann M.D.   On: 04/12/2018 17:22      IMPRESSION AND PLAN:   Seizure activity. Patient will be admitted to medical floor. Seizure precaution, EEG, MRI of the brain, neurology consult. Ativan as needed.  Leukocytosis.  Possible due to reaction.  Follow-up CBC.  Hypokalemia.  Give potassium supplement, follow-up potassium and magnesium.  Asthma.  Stable.  All the records are reviewed and case  discussed with ED provider. Management plans discussed with the patient, her mother and they are in agreement.  CODE STATUS: Full code  TOTAL TIME TAKING CARE OF THIS PATIENT: 42 minutes.    Shaune PollackQing Jobina Maita M.D on 04/12/2018 at 6:36 PM  Between 7am to 6pm - Pager - [phone]  After 6pm go to [email or web address] - Social research officer, governmentpassword [email or web address]  [facility] [place] [facility]  Office  [phone]  CC: Primary care physician; Patient, No Pcp Per   Note: This dictation was prepared with Dragon dictation along with smaller phrase technology. Any transcriptional errors that result from this process are unin

## 2018-04-12 NOTE — ED Triage Notes (Signed)
Pt brought in by [facility] due to seizure witnessed while [place] [employer]. Bystanders witnessed pt having "2 minute seizure." She was postictal when EMS arrived. Pt was not aware of situation or location. Pt later advised she knew the answers to these questions but was unable to answer due to her "jaw being locked up." She c/o headache and right side pain. Pt states she was walking around the store and started to feel dizzy and lightheaded. The next thing she remembers is bystanders talking to her. Unknown if she hit her head. A&Ox4 at this time. Never been dx with seizures and does not take any medication for same. Has seizures "every 3 months" per pt.

## 2018-04-12 NOTE — ED Provider Notes (Addendum)
[facility] Emergency Department Provider Note ____________________________________________   First MD Initiated Contact with Patient 04/12/18 1358     (approximate)  I have reviewed the triage vital signs and the nursing notes.   HISTORY  Chief Complaint Seizures  HPI Denise Lopez is a 27 y.o. female true syncope and collapse as well as seizures every 3 months was presented to the emergency department for seizure.  EMS reports the patient was witnessed seizing by her sister.  EMS reported a total of approximately 2 minutes of generalized tonic-clonic activity.  Patient was confused afterwards but not found to have any focal deficits.  Glucose above 120.  Patient has had previous episodes that been similar but has not seen a neurologist.  Patient reports that she has not eaten for several days because of issues with vomiting.  No loss of bowel or bladder continence.  Patient also says that she is a mild posterior headache and usually has generalized body aches and occasional headaches after seizing.  Patient denies any drinking or drug use.  Says that she has an aunt with a seizure history.  Past Medical History:  Diagnosis Date  . Anxiety   . Asthma   . Syncope and collapse     Patient Active Problem List   Diagnosis Date Noted  . Seizure (HCC) 04/12/2018    Past Surgical History:  Procedure Laterality Date  . TONSILLECTOMY      Prior to Admission medications   Not on File    Allergies Dust mite extract  Family History  Problem Relation Age of Onset  . Anxiety disorder Mother   . Seizures Cousin   . Sudden Cardiac Death Neg Hx     Social History Social History   Tobacco Use  . Smoking status: Never Smoker  . Smokeless tobacco: Never Used  Substance Use Topics  . Alcohol use: Yes    Alcohol/week: 1.0 standard drinks    Types: 1 Shots of liquor per week    Comment: 3 mixed drinks per month  . Drug use: No    Review of  Systems  Constitutional: No fever/chills Eyes: No visual changes. ENT: No sore throat. Cardiovascular: Denies chest pain. Respiratory: Denies shortness of breath. Gastrointestinal: No abdominal pain.  No nausea, no vomiting.  No diarrhea.  No constipation. Genitourinary: Negative for dysuria. Musculoskeletal: Negative for back pain. Skin: Negative for rash. Neurological: Negative for focal weakness or numbness.   ____________________________________________   PHYSICAL EXAM:  VITAL SIGNS: ED Triage Vitals  Enc Vitals Group     BP      Pulse      Resp      Temp      Temp src      SpO2      Weight      Height      Head Circumference      Peak Flow      Pain Score      Pain Loc      Pain Edu?      Excl. in GC?     Constitutional: Initially not talking and says that she cannot open her jaw but then able to open her jaw with normal speech after several seconds of waiting. Eyes: Conjunctivae are normal.  Head: Atraumatic.  No obvious swelling or depression or deformity visualized. Nose: No congestion/rhinnorhea. Mouth/Throat: Mucous membranes are moist.  No evidence of tongue bite or laceration. Neck: No stridor.  Minimal tenderness to palpation along  the distribution of the cervical spine without deformity or step-off. Cardiovascular: Normal rate, regular rhythm. Grossly normal heart sounds.  Respiratory: Normal respiratory effort.  No retractions. Lungs CTAB. Gastrointestinal: Soft and nontender. No distention.  Musculoskeletal: No lower extremity tenderness nor edema.  No joint effusions. Neurologic:  Normal speech and language. No gross focal neurologic deficits are appreciated. Skin:  Skin is warm, dry and intact. No rash noted. Psychiatric: Mood and affect are normal. Speech and behavior are normal.  ____________________________________________   LABS (all labs ordered are listed, but only abnormal results are displayed)  Labs Reviewed  CBC WITH  DIFFERENTIAL/PLATELET - Abnormal; Notable for the following components:      Result Value   WBC 14.6 (*)    RDW 14.6 (*)    Neutro Abs 11.9 (*)    Monocytes Absolute 1.0 (*)    All other components within normal limits  COMPREHENSIVE METABOLIC PANEL - Abnormal; Notable for the following components:   Potassium 3.0 (*)    Glucose, Bld 130 (*)    Calcium 8.4 (*)    Albumin 3.4 (*)    All other components within normal limits  URINE DRUG SCREEN, QUALITATIVE ([facility] ONLY) - Abnormal; Notable for the following components:   Cannabinoid 50 Ng, Ur [place] POSITIVE (*)    Benzodiazepine, Ur Scrn TEST NOT PERFORMED, REAGENT NOT AVAILABLE (*)    All other components within normal limits  URINALYSIS, COMPLETE (UACMP) WITH MICROSCOPIC - Abnormal; Notable for the following components:   Color, Urine AMBER (*)    APPearance CLOUDY (*)    Hgb urine dipstick LARGE (*)    Ketones, ur 5 (*)    Protein, ur 30 (*)    Bacteria, UA FEW (*)    All other components within normal limits  URINE CULTURE  POC URINE PREG, ED  POCT PREGNANCY, URINE   ____________________________________________  EKG   ____________________________________________  RADIOLOGY  No acute finding on CT the brain or the CAT scan of the cervical spine. ____________________________________________   PROCEDURES  Procedure(s) performed:   Procedures  Critical Care performed:   ____________________________________________   INITIAL IMPRESSION / ASSESSMENT AND PLAN / ED COURSE  Pertinent labs & imaging results that were available during my care of the patient were reviewed by me and considered in my medical decision making (see chart for details).  DDX: Seizure, anxiety, pseudoseizure, electrolyte abnormality, drug abuse, enteritis As part of my medical decision making, I reviewed the following data within the electronic MEDICAL RECORD NUMBER Notes from prior ED visits  ----------------------------------------- 6:24 PM on  04/12/2018 -----------------------------------------  Patient at this time appears slightly more somnolent than when I had initially evaluated her.  The family is very concerned about this and the patient says that her body feels "heavy."  Possible postictal state.  Her sister is at the bedside now and describes the patient moving her knees up to her chest during the shaking episode.  Also states that the patient fell and hit the back of her head and is concerned about the patient's headache.  Family also concerned about dark in coloration of the patient's fingers and swelling in the hands.  However, I do not observe any edema.  Patient with 5 out of 5 strength with sensation intact to the bilateral upper extremities.  Radial pulses intact and equal bilaterally.  Discussed the case as well with Dr. Thad Rangereynolds of neurology who does not recommend starting antiepileptics at this time until further work-up is completed.  No tongue  bite.  There is some characteristics of the seizure including the knees drawn to the chest which do not sound epileptiform.  To require further work-up.  Signed out to Dr. Imogene Burn. ____________________________________________   FINAL CLINICAL IMPRESSION(S) / ED DIAGNOSES  Seizure-like activity.  Postictal state.  NEW MEDICATIONS STARTED DURING THIS VISIT:  New Prescriptions   No medications on file     Note:  This document was prepared using Dragon voice recognition software and may include unintentional dictation errors.     Myrna Blazer, MD 04/12/18 1827    Myrna Blazer, MD 04/12/18 [phone]

## 2018-04-13 ENCOUNTER — Inpatient Hospital Stay: Payer: Commercial Managed Care - PPO

## 2018-04-13 DIAGNOSIS — R569 Unspecified convulsions: Principal | ICD-10-CM

## 2018-04-13 LAB — BASIC METABOLIC PANEL
ANION GAP: 4 — AB (ref 5–15)
BUN: 7 mg/dL (ref 6–20)
CHLORIDE: 110 mmol/L (ref 98–111)
CO2: 25 mmol/L (ref 22–32)
CREATININE: 0.68 mg/dL (ref 0.44–1.00)
Calcium: 8.5 mg/dL — ABNORMAL LOW (ref 8.9–10.3)
GFR calc Af Amer: >60 mL/min (ref >60)
GFR calc non Af Amer: >60 mL/min (ref >60)
GLUCOSE: 94 mg/dL (ref 70–99)
Potassium: 4.1 mmol/L (ref 3.5–5.1)
Sodium: 139 mmol/L (ref 135–145)

## 2018-04-13 LAB — URINE CULTURE: CULTURE: NO GROWTH

## 2018-04-13 LAB — CBC
HCT: 37.6 % (ref 35.0–47.0)
HEMOGLOBIN: 12.4 g/dL (ref 12.0–16.0)
MCH: 28.4 pg (ref 26.0–34.0)
MCHC: 32.9 g/dL (ref 32.0–36.0)
MCV: 86.2 fL (ref 80.0–100.0)
Platelets: 221 10*3/uL (ref 150–440)
RBC: 4.36 MIL/uL (ref 3.80–5.20)
RDW: 15.1 % — ABNORMAL HIGH (ref 11.5–14.5)
WBC: 12.5 10*3/uL — ABNORMAL HIGH (ref 3.6–11.0)

## 2018-04-13 MED ORDER — GADOBENATE DIMEGLUMINE 529 MG/ML IV SOLN
15.0000 mL | Freq: Once | INTRAVENOUS | Status: AC | PRN
  Administered 2018-04-13: 02:00:00 12 mL via INTRAVENOUS

## 2018-04-13 MED ORDER — LEVETIRACETAM 500 MG PO TABS: 500.0000 mg | ORAL_TABLET | Freq: Two times a day (BID) | ORAL | 0 refills | Status: DC

## 2018-04-13 NOTE — Progress Notes (Signed)
Pt is able to be discharged with keppra 500 mg BID per neurologist.

## 2018-04-13 NOTE — Consult Note (Signed)
STROKE TEAM PROGRESS NOTE    HPI: From Chart: Denise Lopez is a 27 y.o. female true syncope and collapse as well as seizures every 3 months was presented to the emergency department for seizure.  EMS reports the patient was witnessed seizing by her sister.  EMS reported a total of approximately 2 minutes of generalized tonic-clonic activity.  Patient was confused afterwards but not found to have any focal deficits.  Glucose above 120.  Patient has had previous episodes that been similar but has not seen a neurologist.  Patient reports that she has not eaten for several days because of issues with vomiting.  No loss of bowel or bladder continence.  Patient also says that she is a mild posterior headache and usually has generalized body aches and occasional headaches after seizing.  Patient denies any drinking or drug use.  Says that she has an aunt with a seizure history.  SUBJECTIVE (INTERVAL HISTORY) Patient started having syncope at work, she would feel dizzy and lay on the floor. Started 3 years ago. Would feel lightheaded, pale, vision and hearing would "go out", laying down would help. Most recently she was at [employer], started feeling the same symptoms and she "fell out" she had 2 seizures shaking all over, eyes rolled back into head, no urination or biting on tongue, she had 2 episodes a minute each, patient doesn't remember, she was confused and sleepy afterwards and slept all day yesterday. Her aunt has seizures, she does not report having any seizures as a child, not a preemie.    OBJECTIVE Vitals:   04/12/18 2015 04/12/18 2029 04/12/18 2050 04/13/18 0558  BP: (!) 105/54 (!) 95/57 129/65 (!) 99/54  Pulse: 81 72 69 65  Resp: 13 18 18 18   Temp:  98.5 F (36.9 C) 98.3 F (36.8 C) 98.8 F (37.1 C)  TempSrc:  Oral Oral Oral  SpO2: 100% 100% 99% 100%  Weight:      Height:        CBC:  Recent Labs  Lab 04/12/18 1357 04/13/18 0319  WBC 14.6* 12.5*  NEUTROABS 11.9*  --   HGB 12.2  12.4  HCT 37.4 37.6  MCV 86.3 86.2  PLT 221 221    Basic Metabolic Panel:  Recent Labs  Lab 04/12/18 1357 04/13/18 0319  NA 137 139  K 3.0* 4.1  CL 107 110  CO2 22 25  GLUCOSE 130* 94  BUN 10 7  CREATININE 0.84 0.68  CALCIUM 8.4* 8.5*  MG 2.0  --     Lipid Panel: No results found for: CHOL, TRIG, HDL, CHOLHDL, VLDL, LDLCALC HgbA1c: No results found for: HGBA1C Urine Drug Screen:     Component Value Date/Time   LABOPIA NONE DETECTED 04/12/2018 1357   COCAINSCRNUR NONE DETECTED 04/12/2018 1357   LABBENZ TEST NOT PERFORMED, REAGENT NOT AVAILABLE (A) 04/12/2018 1357   AMPHETMU NONE DETECTED 04/12/2018 1357   THCU POSITIVE (A) 04/12/2018 1357   LABBARB NONE DETECTED 04/12/2018 1357    Alcohol Level No results found for: ETH  IMAGING  Ct Head Wo Contrast  Result Date: 04/12/2018 CLINICAL DATA:  Witnessed seizure 2 minutes period headache and right-sided pain. EXAM: CT HEAD WITHOUT CONTRAST CT CERVICAL SPINE WITHOUT CONTRAST TECHNIQUE: Multidetector CT imaging of the head and cervical spine was performed following the standard protocol without intravenous contrast. Multiplanar CT image reconstructions of the cervical spine were also generated. COMPARISON:  12/23/2016 FINDINGS: CT HEAD FINDINGS Brain: The brainstem, cerebellum, cerebral peduncles, thalami, basal  ganglia, basilar cisterns, and ventricular system appear within normal limits. No intracranial hemorrhage, mass lesion, or acute CVA. Vascular: Unremarkable Skull: Unremarkable Sinuses/Orbits: Mild chronic ethmoid and sphenoid sinusitis. Other: No supplemental non-categorized findings. CT CERVICAL SPINE FINDINGS Alignment: No vertebral subluxation is observed. Skull base and vertebrae: No fracture or acute bony findings. Soft tissues and spinal canal: Scattered small internal jugular lymph nodes are likely incidental. Disc levels:  No osseous impingement. Upper chest: Unremarkable Other: No supplemental non-categorized  findings. IMPRESSION: 1. No significant intracranial abnormality and no significant cervical spine abnormality. 2. Mild chronic ethmoid and sphenoid sinusitis. Electronically Signed   By: Gaylyn Rong M.D.   On: 04/12/2018 17:22   Ct Cervical Spine Wo Contrast  Result Date: 04/12/2018 CLINICAL DATA:  Witnessed seizure 2 minutes period headache and right-sided pain. EXAM: CT HEAD WITHOUT CONTRAST CT CERVICAL SPINE WITHOUT CONTRAST TECHNIQUE: Multidetector CT imaging of the head and cervical spine was performed following the standard protocol without intravenous contrast. Multiplanar CT image reconstructions of the cervical spine were also generated. COMPARISON:  12/23/2016 FINDINGS: CT HEAD FINDINGS Brain: The brainstem, cerebellum, cerebral peduncles, thalami, basal ganglia, basilar cisterns, and ventricular system appear within normal limits. No intracranial hemorrhage, mass lesion, or acute CVA. Vascular: Unremarkable Skull: Unremarkable Sinuses/Orbits: Mild chronic ethmoid and sphenoid sinusitis. Other: No supplemental non-categorized findings. CT CERVICAL SPINE FINDINGS Alignment: No vertebral subluxation is observed. Skull base and vertebrae: No fracture or acute bony findings. Soft tissues and spinal canal: Scattered small internal jugular lymph nodes are likely incidental. Disc levels:  No osseous impingement. Upper chest: Unremarkable Other: No supplemental non-categorized findings. IMPRESSION: 1. No significant intracranial abnormality and no significant cervical spine abnormality. 2. Mild chronic ethmoid and sphenoid sinusitis. Electronically Signed   By: Gaylyn Rong M.D.   On: 04/12/2018 17:22   Mr Laqueta Jean NF Contrast  Result Date: 04/13/2018 CLINICAL DATA:  Initial evaluation for new onset seizure. EXAM: MRI HEAD WITHOUT AND WITH CONTRAST TECHNIQUE: Multiplanar, multiecho pulse sequences of the brain and surrounding structures were obtained without and with intravenous contrast.  CONTRAST:  12mL MULTIHANCE GADOBENATE DIMEGLUMINE 529 MG/ML IV SOLN COMPARISON:  Prior CT from 04/12/2018. FINDINGS: Brain: Cerebral volume within normal limits for patient age. No focal parenchymal signal abnormality identified. No abnormal foci of restricted diffusion to suggest acute or subacute ischemia. Gray-white matter differentiation well maintained. No encephalomalacia to suggest chronic infarction. No foci of susceptibility artifact to suggest acute or chronic intracranial hemorrhage. Mass lesion, midline shift or mass effect. No hydrocephalus. No extra-axial fluid collection. Major dural sinuses are grossly patent. Pituitary gland and suprasellar region are normal. Midline structures intact and normal. No intrinsic temporal lobe abnormality.  No abnormal enhancement. Vascular: Major intracranial vascular flow voids well maintained and normal in appearance. Skull and upper cervical spine: Craniocervical junction normal. Visualized upper cervical spine within normal limits. Bone marrow signal intensity normal. No scalp soft tissue abnormality. Sinuses/Orbits: Globes and orbital soft tissues within normal limits. Paranasal sinuses are clear. No mastoid effusion. Inner ear structures normal. Other: None. IMPRESSION: Normal MRI of the brain. No acute intracranial abnormality identified. Electronically Signed   By: Rise Mu M.D.   On: 04/13/2018 06:08       PHYSICAL EXAM  Physical exam: Exam: Gen: NAD, conversant, well nourised, well groomed                     CV: RRR, no MRG. No Carotid Bruits. No peripheral edema, warm, nontender Eyes: Conjunctivae clear  without exudates or hemorrhage  Neuro: Detailed Neurologic Exam  Speech:    Speech is normal; fluent and spontaneous with normal comprehension.  Cognition:    The patient is oriented to person, place, and time;     recent and remote memory intact;     language fluent;     normal attention, concentration,     fund of  knowledge Cranial Nerves:    The pupils are equal, round, and reactive to light. The fundi are normal and spontaneous venous pulsations are present. Visual fields are full to finger confrontation. Extraocular movements are intact. Trigeminal sensation is intact and the muscles of mastication are normal. The face is symmetric. The palate elevates in the midline. Hearing intact. Voice is normal. Shoulder shrug is normal. The tongue has normal motion without fasciculations.   Coordination:    Normal finger to nose and heel to shin. Normal rapid alternating movements.   Gait:    Heel-toe and tandem gait are normal.   Motor Observation:    No asymmetry, no atrophy, and no involuntary movements noted. Tone:    Normal muscle tone.    Posture:    Posture is normal. normal erect    Strength:    Strength is V/V in the upper and lower limbs.      Sensation: intact to LT     Reflex Exam:  DTR's:    Deep tendon reflexes in the upper and lower extremities are normal bilaterally.   Toes:    The toes are downgoing bilaterally.   Clonus:    Clonus is absent.    ASSESSMENT/PLAN Ms. Denise Lopez is a 27 y.o. female with history of anxiety, asthma, syncope vs seizure activity  presenting with possible seizure. Witnessed 2-minute GTCS which by report has occurred in past but has never seen a neurologist.   - Previous episodes sounded like vasovagal syncope however this incident sounds very much like a seizure.  - not planning on any pregnancy, using birth control - Start patient on AED, Keppra 500mg  bid - needs outpatient workup including EEG - MRI brain was normal - Patient is unable to drive, operate heavy machinery, perform activities at heights or participate in water activities until 6 months to one year seizure free - seizure precautions - she has been evaluated by cardiology in the past with holter monitor - Discussed Patients with epilepsy have a small risk of sudden unexpected  death, a condition referred to as sudden unexpected death in epilepsy (SUDEP). SUDEP is defined specifically as the sudden, unexpected, witnessed or unwitnessed, nontraumatic and nondrowning death in patients with epilepsy with or without evidence for a seizure, and excluding documented status epilepticus, in which post mortem examination does not reveal a structural or toxicologic cause for death  - can be discharged on Keppra, needs neurology follow up in 4-6 weeks if possible  Hospital day # 1  Personally examined patient and images, and have participated in and made any corrections needed to history, physical, neuro exam,assessment and plan as stated above.  I have personally obtained the history, evaluated lab date, reviewed imaging studies and agree with radiology interpretations.    Naomie DeanAntonia Ahern, MD

## 2018-04-13 NOTE — Plan of Care (Signed)

## 2018-04-13 NOTE — Progress Notes (Signed)
[facility]         [place], [place]   04/13/2018  Patient: Denise Lopez   Date of Birth:  1991-08-19  Date of admission:  04/12/2018  Date of Discharge  04/13/2018    To Whom it May Concern:   Denise Lopez  May return to work on 04/14/18. We advise her NOT to Drive or operate heavy machines for 6 months.  If you have any questions or concerns, please don't hesitate to call.  Sincerely,   Altamese DillingVaibhavkumar Shaft Corigliano M.D Pager [phone] Office : [phone]   .

## 2018-04-14 LAB — HIV ANTIBODY (ROUTINE TESTING W REFLEX): HIV Screen 4th Generation wRfx: NONREACTIVE

## 2018-04-17 ENCOUNTER — Telehealth: Payer: Self-pay

## 2018-04-17 NOTE — Telephone Encounter (Signed)
Emmi Follow Noted on report that the patient didn't know who to contact if there was a change in condition.  She stated she probably hit the wrong button.  She has her prescriptions filled and taking as prescribed.  Follow up appointment is scheduled for Monday.  No needs noted for today.

## 2018-04-20 NOTE — Discharge Summary (Signed)
[facility] [place] [facility]   PATIENT NAME: Denise Lopez    MR#:  [id number]  DATE OF BIRTH:  Mar 05, 1991  DATE OF ADMISSION:  04/12/2018 ADMITTING PHYSICIAN: Shaune Pollack, MD  DATE OF DISCHARGE: 04/13/2018  6:44 PM  PRIMARY CARE PHYSICIAN: Patient, No Pcp Per    ADMISSION DIAGNOSIS:  Seizure-like activity (HCC) [R56.9] Post-ictal state (HCC) [R56.9]  DISCHARGE DIAGNOSIS:  Active Problems:   Seizure (HCC)   SECONDARY DIAGNOSIS:   Past Medical History:  Diagnosis Date  . Anxiety   . Asthma   . Syncope and collapse     HOSPITAL COURSE:   Pt came with seizures- had MRI brain was done, negative. Seen by neurologist- Suggested to take keppra daily. Follow in Neurology clinic.  DISCHARGE CONDITIONS:   Stable.  CONSULTS OBTAINED:    DRUG ALLERGIES:   Allergies  Allergen Reactions  . Dust Mite Extract     DISCHARGE MEDICATIONS:   Allergies as of 04/13/2018      Reactions   Dust Mite Extract       Medication List    TAKE these medications   levETIRAcetam 500 MG tablet Commonly known as:  KEPPRA Take 1 tablet (500 mg total) by mouth 2 (two) times daily.        DISCHARGE INSTRUCTIONS:    Follow with neurologist clinic in 1-2 weeks.  If you experience worsening of your admission symptoms, develop shortness of breath, life threatening emergency, suicidal or homicidal thoughts you must seek medical attention immediately by calling 911 or calling your MD immediately  if symptoms less severe.  You Must read complete instructions/literature along with all the possible adverse reactions/side effects for all the Medicines you take and that have been prescribed to you. Take any new Medicines after you have completely understood and accept all the possible adverse reactions/side effects.   Please note  You were cared for by a hospitalist during your hospital stay. If you have any questions about your discharge medications or the  care you received while you were in the hospital after you are discharged, you can call the unit and asked to speak with the hospitalist on call if the hospitalist that took care of you is not available. Once you are discharged, your primary care physician will handle any further medical issues. Please note that NO REFILLS for any discharge medications will be authorized once you are discharged, as it is imperative that you return to your primary care physician (or establish a relationship with a primary care physician if you do not have one) for your aftercare needs so that they can reassess your need for medications and monitor your lab values.    Today   CHIEF COMPLAINT:   Chief Complaint  Patient presents with  . Seizures    HISTORY OF PRESENT ILLNESS:  Denise Lopez  is a 27 y.o. female with a known history of seizure, asthma, anxiety and syncope.  The patient was sent by EMS due to witnessed seizure by her sister today.  Per EMS, a total of 2 minutes of generalized tonic-chronic body shaking.  The patient was confused afterwards.  She complains of headache and dizziness and muscle aching but denies any symptoms..  The patient has not eaten for several days due to vomiting.  The patient denies any incontinence.  She has a similar seizure activity before but never see neurologist.  VITAL SIGNS:  Blood pressure (!) 101/57, pulse 69, temperature 98.6 F (37 C), temperature  source Oral, resp. rate 20, height 5\' 9"  (1.753 m), weight 61.2 kg, SpO2 100 %.  I/O:  No intake or output data in the 24 hours ending 04/20/18 2102  PHYSICAL EXAMINATION:  GENERAL:  27 y.o.-year-old patient lying in the bed with no acute distress.  EYES: Pupils equal, round, reactive to light and accommodation. No scleral icterus. Extraocular muscles intact.  HEENT: Head atraumatic, normocephalic. Oropharynx and nasopharynx clear.  NECK:  Supple, no jugular venous distention. No thyroid enlargement, no tenderness.   LUNGS: Normal breath sounds bilaterally, no wheezing, rales,rhonchi or crepitation. No use of accessory muscles of respiration.  CARDIOVASCULAR: S1, S2 normal. No murmurs, rubs, or gallops.  ABDOMEN: Soft, non-tender, non-distended. Bowel sounds present. No organomegaly or mass.  EXTREMITIES: No pedal edema, cyanosis, or clubbing.  NEUROLOGIC: Cranial nerves II through XII are intact. Muscle strength 5/5 in all extremities. Sensation intact. Gait not checked.  PSYCHIATRIC: The patient is alert and oriented x 3.  SKIN: No obvious rash, lesion, or ulcer.   DATA REVIEW:   CBC No results for input(s): WBC, HGB, HCT, PLT in the last 168 hours.  Chemistries  No results for input(s): NA, K, CL, CO2, GLUCOSE, BUN, CREATININE, CALCIUM, MG, AST, ALT, ALKPHOS, BILITOT in the last 168 hours.  Invalid input(s): GFRCGP  Cardiac Enzymes No results for input(s): TROPONINI in the last 168 hours.  Microbiology Results  Results for orders placed or performed during the hospital encounter of 04/12/18  Urine Culture     Status: None   Collection Time: 04/12/18  1:57 PM  Result Value Ref Range Status   Specimen Description   Final    URINE, RANDOM Performed at [facility], [address] [place], [place] [address]    Special Requests   Final    NONE Performed at [facility], [address] [place], [place] [address]    Culture   Final    NO GROWTH Performed at [facility], [address] [place], [place] [address]    Report Status 04/13/2018 FINAL  Final    RADIOLOGY:  No results found.  EKG:   Orders placed or performed during the hospital encounter of 12/22/16  . ED EKG  . ED EKG      Management plans discussed with the patient, family and they are in agreement.  CODE STATUS:  Code Status History    Date Active Date Inactive Code Status Order ID Comments User Context   04/12/2018 2032 04/13/2018 2159 Full Code [id number]  Shaune Pollackhen, Qing, MD Inpatient       TOTAL TIME TAKING CARE OF THIS PATIENT: 35 minutes.    Altamese DillingVaibhavkumar Paula Zietz M.D on 04/20/2018 at 9:02 PM  Between 7am to 6pm - Pager - [phone]  After 6pm go to [email or web address] - password [email or web address]  [facility]  Office  [phone]  CC: Primary care physician; Patient, No Pcp Per   Note: This dictation was prepared with Dragon dictation along with smaller phrase technology. Any transcriptional errors that result from this process are unintentional.

## 2018-05-05 ENCOUNTER — Encounter: Payer: Self-pay | Admitting: Emergency Medicine

## 2018-05-05 ENCOUNTER — Other Ambulatory Visit: Payer: Self-pay

## 2018-05-05 ENCOUNTER — Emergency Department
Admission: EM | Admit: 2018-05-05 | Discharge: 2018-05-05 | Disposition: A | Discharge status: Home / Self Care | Payer: Commercial Managed Care - PPO | Attending: Emergency Medicine | Admitting: Emergency Medicine

## 2018-05-05 DIAGNOSIS — J45909 Unspecified asthma, uncomplicated: Secondary | ICD-10-CM | POA: Diagnosis not present

## 2018-05-05 DIAGNOSIS — R569 Unspecified convulsions: Secondary | ICD-10-CM | POA: Diagnosis not present

## 2018-05-05 LAB — URINALYSIS, COMPLETE (UACMP) WITH MICROSCOPIC
BACTERIA UA: NONE SEEN
Bilirubin Urine: NEGATIVE
GLUCOSE, UA: NEGATIVE mg/dL
Hgb urine dipstick: NEGATIVE
Ketones, ur: 5 mg/dL — AB
Leukocytes, UA: NEGATIVE
NITRITE: NEGATIVE
PROTEIN: 30 mg/dL — AB
Specific Gravity, Urine: 1.028 (ref 1.005–1.030)
pH: 6 (ref 5.0–8.0)

## 2018-05-05 LAB — CBC WITH DIFFERENTIAL/PLATELET
Basophils Absolute: 0.1 10*3/uL (ref 0–0.1)
Basophils Relative: 1 %
Eosinophils Absolute: 0.2 10*3/uL (ref 0–0.7)
Eosinophils Relative: 3 %
HEMATOCRIT: 37.3 % (ref 35.0–47.0)
Hemoglobin: 12.1 g/dL (ref 12.0–16.0)
LYMPHS ABS: 1.9 10*3/uL (ref 1.0–3.6)
Lymphocytes Relative: 29 %
MCH: 27.9 pg (ref 26.0–34.0)
MCHC: 32.5 g/dL (ref 32.0–36.0)
MCV: 86 fL (ref 80.0–100.0)
MONOS PCT: 10 %
Monocytes Absolute: 0.6 10*3/uL (ref 0.2–0.9)
NEUTROS PCT: 57 %
Neutro Abs: 3.9 10*3/uL (ref 1.4–6.5)
Platelets: 223 10*3/uL (ref 150–440)
RBC: 4.33 MIL/uL (ref 3.80–5.20)
RDW: 15.1 % — ABNORMAL HIGH (ref 11.5–14.5)
WBC: 6.8 10*3/uL (ref 3.6–11.0)

## 2018-05-05 LAB — COMPREHENSIVE METABOLIC PANEL
ALT: 18 U/L (ref 0–44)
AST: 24 U/L (ref 15–41)
Albumin: 3.9 g/dL (ref 3.5–5.0)
Alkaline Phosphatase: 50 U/L (ref 38–126)
Anion gap: 7 (ref 5–15)
BILIRUBIN TOTAL: 1.3 mg/dL — AB (ref 0.3–1.2)
BUN: 9 mg/dL (ref 6–20)
CO2: 23 mmol/L (ref 22–32)
Calcium: 8.8 mg/dL — ABNORMAL LOW (ref 8.9–10.3)
Chloride: 108 mmol/L (ref 98–111)
Creatinine, Ser: 0.78 mg/dL (ref 0.44–1.00)
GFR calc non Af Amer: >60 mL/min (ref >60)
Glucose, Bld: 88 mg/dL (ref 70–99)
Potassium: 3.6 mmol/L (ref 3.5–5.1)
Sodium: 138 mmol/L (ref 135–145)
Total Protein: 7 g/dL (ref 6.5–8.1)

## 2018-05-05 LAB — ETHANOL: Alcohol, Ethyl (B): <10 mg/dL (ref <10)

## 2018-05-05 LAB — URINE DRUG SCREEN, QUALITATIVE (ARMC ONLY)
Amphetamines, Ur Screen: NOT DETECTED
BARBITURATES, UR SCREEN: NOT DETECTED
BENZODIAZEPINE, UR SCRN: NOT DETECTED
Cannabinoid 50 Ng, Ur ~~LOC~~: POSITIVE — AB
Cocaine Metabolite,Ur ~~LOC~~: NOT DETECTED
MDMA (Ecstasy)Ur Screen: NOT DETECTED
METHADONE SCREEN, URINE: NOT DETECTED
OPIATE, UR SCREEN: NOT DETECTED
Phencyclidine (PCP) Ur S: NOT DETECTED
Tricyclic, Ur Screen: NOT DETECTED

## 2018-05-05 LAB — HCG, QUANTITATIVE, PREGNANCY

## 2018-05-05 MED ORDER — LEVETIRACETAM 750 MG PO TABS: 750.0000 mg | ORAL_TABLET | Freq: Two times a day (BID) | ORAL | 1 refills | Status: DC

## 2018-05-05 MED ORDER — LEVETIRACETAM 500 MG PO TABS
500.0000 mg | ORAL_TABLET | Freq: Once | ORAL | Status: AC
  Administered 2018-05-05: 500 mg via ORAL
  Filled 2018-05-05: qty 1

## 2018-05-05 MED ORDER — ONDANSETRON HCL 4 MG/2ML IJ SOLN
4.0000 mg | Freq: Once | INTRAMUSCULAR | Status: AC
  Administered 2018-05-05: 4 mg via INTRAVENOUS
  Filled 2018-05-05: qty 2

## 2018-05-05 MED ORDER — DEXTROSE IN LACTATED RINGERS 5 % IV SOLN
Freq: Once | INTRAVENOUS | Status: AC
  Administered 2018-05-05: 18:00:00 via INTRAVENOUS

## 2018-05-05 MED ORDER — DEXTROSE 5 % AND 0.9 % NACL IV BOLUS
1000.0000 mL | Freq: Once | INTRAVENOUS | Status: DC
  Filled 2018-05-05: qty 1000

## 2018-05-05 MED ORDER — ONDANSETRON 4 MG PO TBDP: 4.0000 mg | ORAL_TABLET | Freq: Three times a day (TID) | ORAL | 0 refills | Status: DC | PRN

## 2018-05-05 NOTE — ED Provider Notes (Signed)
[facility] Emergency Department Provider Note       Time seen: ----------------------------------------- 4:14 PM on 05/05/2018 -----------------------------------------   I have reviewed the triage vital signs and the nursing notes.  HISTORY   Chief Complaint Seizures    HPI Denise Lopez is a 27 y.o. female with a history of anxiety, asthma, seizures, syncope and collapse who presents to the ED for possible seizure.  EMS reports patient's mom reports she had 4-5 seizures prior to EMS arrival.  EMS reports that the patient stated the lights were bothering her, she had a headache in her body felt heavy.  She was seen 3 weeks ago for seizures and started on 500 mg of Keppra twice a day.  She denies fevers, chills or other complaints.  Patient reports very poor p.o. intake with frequent vomiting.  Past Medical History:  Diagnosis Date  . Anxiety   . Asthma   . Syncope and collapse     Patient Active Problem List   Diagnosis Date Noted  . Seizure (HCC) 04/12/2018    Past Surgical History:  Procedure Laterality Date  . TONSILLECTOMY      Allergies Dust mite extract  Social History Social History   Tobacco Use  . Smoking status: Never Smoker  . Smokeless tobacco: Never Used  Substance Use Topics  . Alcohol use: Yes    Alcohol/week: 1.0 standard drinks    Types: 1 Shots of liquor per week    Comment: 3 mixed drinks per month  . Drug use: No   Review of Systems Constitutional: Negative for fever. Cardiovascular: Negative for chest pain. Respiratory: Negative for shortness of breath. Gastrointestinal: Negative for abdominal pain, positive for recent vomiting Musculoskeletal: Negative for back pain. Skin: Negative for rash. Neurological: Negative for headaches, focal weakness or numbness.  Positive for seizure  All systems negative/normal/unremarkable except as stated in the  HPI  ____________________________________________   PHYSICAL EXAM:  VITAL SIGNS: ED Triage Vitals  Enc Vitals Group     BP      Pulse      Resp      Temp      Temp src      SpO2      Weight      Height      Head Circumference      Peak Flow      Pain Score      Pain Loc      Pain Edu?      Excl. in GC?    Constitutional: Alert and oriented. Well appearing and in no distress. Eyes: Conjunctivae are normal. Normal extraocular movements. ENT   Head: Normocephalic and atraumatic.   Nose: No congestion/rhinnorhea.   Mouth/Throat: Mucous membranes are moist.   Neck: No stridor. Cardiovascular: Normal rate, regular rhythm. No murmurs, rubs, or gallops. Respiratory: Normal respiratory effort without tachypnea nor retractions. Breath sounds are clear and equal bilaterally. No wheezes/rales/rhonchi. Gastrointestinal: Soft and nontender. Normal bowel sounds Musculoskeletal: Nontender with normal range of motion in extremities. No lower extremity tenderness nor edema. Neurologic:  Normal speech and language. No gross focal neurologic deficits are appreciated.  Skin:  Skin is warm, dry and intact. No rash noted. Psychiatric: Mood and affect are normal. Speech and behavior are normal.  ____________________________________________  ED COURSE:  As part of my medical decision making, I reviewed the following data within the electronic MEDICAL RECORD NUMBER History obtained from family if available, nursing notes, old chart and ekg, as well  as notes from prior ED visits. Patient presented for possible seizure, we will assess with labs and imaging as indicated at this time. Clinical Course as of May 05 1805  Wynelle Link May 05, 2018  1712 Patient continues to be neurologically intact, drinking water   [JW]    Clinical Course User Index [JW] Emily Filbert, MD   Procedures ____________________________________________   LABS (pertinent positives/negatives)  Labs Reviewed   CBC WITH DIFFERENTIAL/PLATELET - Abnormal; Notable for the following components:      Result Value   RDW 15.1 (*)    All other components within normal limits  COMPREHENSIVE METABOLIC PANEL - Abnormal; Notable for the following components:   Calcium 8.8 (*)    Total Bilirubin 1.3 (*)    All other components within normal limits  URINE DRUG SCREEN, QUALITATIVE ([facility] ONLY) - Abnormal; Notable for the following components:   Cannabinoid 50 Ng, Ur [place] POSITIVE (*)    All other components within normal limits  URINALYSIS, COMPLETE (UACMP) WITH MICROSCOPIC - Abnormal; Notable for the following components:   Color, Urine AMBER (*)    APPearance HAZY (*)    Ketones, ur 5 (*)    Protein, ur 30 (*)    All other components within normal limits  ETHANOL  HCG, QUANTITATIVE, PREGNANCY   ____________________________________________  DIFFERENTIAL DIAGNOSIS   Seizure, anxiety, pseudoseizure, medication noncompliance  FINAL ASSESSMENT AND PLAN  Seizure-like activity   Plan: The patient had presented for seizure-like activity. Patient's labs did not reveal any acute process.  He did not require any imaging during her visit.  She continues to look well and had no further events.  I am doubtful this was a true tonic-clonic type seizure.  However we will increase her Keppra and have her follow-up with 750mg  of Keppra twice a day as well as antiemetics as needed   Ulice Dash, MD   Note: This note was generated in part or whole with voice recognition software. Voice recognition is usually quite accurate but there are transcription errors that can and very often do occur. I apologize for any typographical errors that were not detected and corrected.     Emily Filbert, MD 05/05/18 1806

## 2018-05-05 NOTE — ED Triage Notes (Signed)
Pt presents to ED via [facility] with c/o seizures. EMS reports per pt's mom pt had approx 4-5 seizures prior to EMS arrival. EMS reports pt reports lights are bothering her, HA, and "feels heavy", was seen 8/18 for seizures and prescribed Keppra 500mg  BID, has not had neuro follow up yet.

## 2018-10-01 ENCOUNTER — Emergency Department: Payer: Self-pay

## 2018-10-01 ENCOUNTER — Encounter: Payer: Self-pay | Admitting: Emergency Medicine

## 2018-10-01 ENCOUNTER — Emergency Department
Admission: EM | Admit: 2018-10-01 | Discharge: 2018-10-01 | Disposition: A | Discharge status: Home / Self Care | Payer: Self-pay | Attending: Student in an Organized Health Care Education/Training Program | Admitting: Student in an Organized Health Care Education/Training Program

## 2018-10-01 ENCOUNTER — Other Ambulatory Visit: Payer: Self-pay

## 2018-10-01 DIAGNOSIS — Z79899 Other long term (current) drug therapy: Secondary | ICD-10-CM | POA: Insufficient documentation

## 2018-10-01 DIAGNOSIS — J45909 Unspecified asthma, uncomplicated: Secondary | ICD-10-CM | POA: Insufficient documentation

## 2018-10-01 DIAGNOSIS — F419 Anxiety disorder, unspecified: Secondary | ICD-10-CM | POA: Insufficient documentation

## 2018-10-01 DIAGNOSIS — R1032 Left lower quadrant pain: Secondary | ICD-10-CM | POA: Insufficient documentation

## 2018-10-01 HISTORY — DX: Unspecified convulsions: R56.9

## 2018-10-01 LAB — URINALYSIS, COMPLETE (UACMP) WITH MICROSCOPIC
BACTERIA UA: NONE SEEN
BILIRUBIN URINE: NEGATIVE
Glucose, UA: NEGATIVE mg/dL
HGB URINE DIPSTICK: NEGATIVE
Ketones, ur: NEGATIVE mg/dL
LEUKOCYTES UA: NEGATIVE
NITRITE: NEGATIVE
PROTEIN: NEGATIVE mg/dL
Specific Gravity, Urine: 1.021 (ref 1.005–1.030)
pH: 7 (ref 5.0–8.0)

## 2018-10-01 LAB — COMPREHENSIVE METABOLIC PANEL
ALT: 31 U/L (ref 0–44)
AST: 29 U/L (ref 15–41)
Albumin: 4 g/dL (ref 3.5–5.0)
Alkaline Phosphatase: 62 U/L (ref 38–126)
Anion gap: 5 (ref 5–15)
BUN: 13 mg/dL (ref 6–20)
CHLORIDE: 104 mmol/L (ref 98–111)
CO2: 29 mmol/L (ref 22–32)
Calcium: 9.2 mg/dL (ref 8.9–10.3)
Creatinine, Ser: 0.62 mg/dL (ref 0.44–1.00)
GFR calc Af Amer: >60 mL/min (ref >60)
Glucose, Bld: 95 mg/dL (ref 70–99)
POTASSIUM: 4.2 mmol/L (ref 3.5–5.1)
SODIUM: 138 mmol/L (ref 135–145)
Total Bilirubin: 0.7 mg/dL (ref 0.3–1.2)
Total Protein: 7 g/dL (ref 6.5–8.1)

## 2018-10-01 LAB — CBC
HEMATOCRIT: 37.3 % (ref 36.0–46.0)
HEMOGLOBIN: 11.9 g/dL — AB (ref 12.0–15.0)
MCH: 25.9 pg — ABNORMAL LOW (ref 26.0–34.0)
MCHC: 31.9 g/dL (ref 30.0–36.0)
MCV: 81.3 fL (ref 80.0–100.0)
NRBC: 0 % (ref 0.0–0.2)
Platelets: 393 10*3/uL (ref 150–400)
RBC: 4.59 MIL/uL (ref 3.87–5.11)
RDW: 14.9 % (ref 11.5–15.5)
WBC: 7.5 10*3/uL (ref 4.0–10.5)

## 2018-10-01 LAB — POCT PREGNANCY, URINE: PREG TEST UR: NEGATIVE

## 2018-10-01 LAB — LIPASE, BLOOD: LIPASE: 37 U/L (ref 11–51)

## 2018-10-01 MED ORDER — POLYETHYLENE GLYCOL 3350 17 G PO PACK: 17.0000 g | PACK | Freq: Every day | ORAL | 1 refills | Status: DC

## 2018-10-01 NOTE — ED Triage Notes (Signed)
Pt arrived with complaints of left lower quadrant pain that started Sunday. Pt reports the pain feels like cramping. PT reports 1 episode of emesis this morning.

## 2018-10-01 NOTE — Discharge Instructions (Signed)

## 2018-10-01 NOTE — ED Provider Notes (Signed)
[facility] Emergency Department Provider Note    First MD Initiated Contact with Patient 10/01/18 Paulo [id number]     (approximate)  I have reviewed the triage vital signs and the nursing notes.   HISTORY  Chief Complaint Abdominal Pain    HPI Denise Lopez is a 28 y.o. female below listed past medical history presents for evaluation of left lower quadrant crampy abdominal pain.  States that she started having some discomfort last night doubled over vertebrae.  Of time.  She is currently pain-free.  States that she has been feeling constipated not been able to move her bowels.  She denies any dysuria.  Denies any chance of being pregnant.  Denies any vaginal bleeding or discharge.  Denies any diarrhea.  No recent fevers.   Past Medical History:  Diagnosis Date  . Anxiety   . Asthma   . Seizures (HCC)   . Syncope and collapse    Family History  Problem Relation Age of Onset  . Anxiety disorder Mother   . Seizures Cousin   . Sudden Cardiac Death Neg Hx    Past Surgical History:  Procedure Laterality Date  . TONSILLECTOMY     Patient Active Problem List   Diagnosis Date Noted  . Seizure (HCC) 04/12/2018      Prior to Admission medications   Medication Sig Start Date End Date Taking? Authorizing Provider  levETIRAcetam (KEPPRA) 750 MG tablet Take 1 tablet (750 mg total) by mouth 2 (two) times daily. 05/05/18   Emily FilbertWilliams, Jonathan E, MD  ondansetron (ZOFRAN ODT) 4 MG disintegrating tablet Take 1 tablet (4 mg total) by mouth every 8 (eight) hours as needed for nausea or vomiting. 05/05/18   Emily FilbertWilliams, Jonathan E, MD  polyethylene glycol (MIRALAX / Ethelene HalGLYCOLAX) packet Take 17 g by mouth daily. 10/01/18   Willy Eddyobinson, Ashea Winiarski, MD    Allergies Dust mite extract    Social History Social History   Tobacco Use  . Smoking status: Never Smoker  . Smokeless tobacco: Never Used  Substance Use Topics  . Alcohol use: Yes    Alcohol/week: 1.0 standard drinks   Types: 1 Shots of liquor per week    Comment: 3 mixed drinks per month  . Drug use: No    Review of Systems Patient denies headaches, rhinorrhea, blurry vision, numbness, shortness of breath, chest pain, edema, cough, abdominal pain, nausea, vomiting, diarrhea, dysuria, fevers, rashes or hallucinations unless otherwise stated above in HPI. ____________________________________________   PHYSICAL EXAM:  VITAL SIGNS: Vitals:   10/01/18 1725  BP: 108/64  Pulse: 93  Resp: 18  Temp: 97.9 F (36.6 C)  SpO2: 100%    Constitutional: Alert and oriented.  Eyes: Conjunctivae are normal.  Head: Atraumatic. Nose: No congestion/rhinnorhea. Mouth/Throat: Mucous membranes are moist.   Neck: No stridor. Painless ROM.  Cardiovascular: Normal rate, regular rhythm. Grossly normal heart sounds.  Good peripheral circulation. Respiratory: Normal respiratory effort.  No retractions. Lungs CTAB. Gastrointestinal: Soft and nontender. No distention. No abdominal bruits. No CVA tenderness. Genitourinary: deferred Musculoskeletal: No lower extremity tenderness nor edema.  No joint effusions. Neurologic:  Normal speech and language. No gross focal neurologic deficits are appreciated. No facial droop Skin:  Skin is warm, dry and intact. No rash noted. Psychiatric: Mood and affect are normal. Speech and behavior are normal.  ____________________________________________   LABS (all labs ordered are listed, but only abnormal results are displayed)  Results for orders placed or performed during the hospital encounter of 10/01/18 (from  the past 24 hour(s))  Lipase, blood     Status: None   Collection Time: 10/01/18  5:34 PM  Result Value Ref Range   Lipase 37 11 - 51 U/L  Comprehensive metabolic panel     Status: None   Collection Time: 10/01/18  5:34 PM  Result Value Ref Range   Sodium 138 135 - 145 mmol/L   Potassium 4.2 3.5 - 5.1 mmol/L   Chloride 104 98 - 111 mmol/L   CO2 29 22 - 32 mmol/L    Glucose, Bld 95 70 - 99 mg/dL   BUN 13 6 - 20 mg/dL   Creatinine, Ser 1.320.62 0.44 - 1.00 mg/dL   Calcium 9.2 8.9 - 44.010.3 mg/dL   Total Protein 7.0 6.5 - 8.1 g/dL   Albumin 4.0 3.5 - 5.0 g/dL   AST 29 15 - 41 U/L   ALT 31 0 - 44 U/L   Alkaline Phosphatase 62 38 - 126 U/L   Total Bilirubin 0.7 0.3 - 1.2 mg/dL   GFR calc non Af Amer >60 >60 mL/min   GFR calc Af Amer >60 >60 mL/min   Anion gap 5 5 - 15  CBC     Status: Abnormal   Collection Time: 10/01/18  5:34 PM  Result Value Ref Range   WBC 7.5 4.0 - 10.5 K/uL   RBC 4.59 3.87 - 5.11 MIL/uL   Hemoglobin 11.9 (L) 12.0 - 15.0 g/dL   HCT 10.237.3 72.536.0 - 36.646.0 %   MCV 81.3 80.0 - 100.0 fL   MCH 25.9 (L) 26.0 - 34.0 pg   MCHC 31.9 30.0 - 36.0 g/dL   RDW 44.014.9 34.711.5 - 42.515.5 %   Platelets 393 150 - 400 K/uL   nRBC 0.0 0.0 - 0.2 %  Urinalysis, Complete w Microscopic     Status: Abnormal   Collection Time: 10/01/18  5:35 PM  Result Value Ref Range   Color, Urine YELLOW (A) YELLOW   APPearance CLEAR (A) CLEAR   Specific Gravity, Urine 1.021 1.005 - 1.030   pH 7.0 5.0 - 8.0   Glucose, UA NEGATIVE NEGATIVE mg/dL   Hgb urine dipstick NEGATIVE NEGATIVE   Bilirubin Urine NEGATIVE NEGATIVE   Ketones, ur NEGATIVE NEGATIVE mg/dL   Protein, ur NEGATIVE NEGATIVE mg/dL   Nitrite NEGATIVE NEGATIVE   Leukocytes, UA NEGATIVE NEGATIVE   RBC / HPF 0-5 0 - 5 RBC/hpf   WBC, UA 0-5 0 - 5 WBC/hpf   Bacteria, UA NONE SEEN NONE SEEN   Squamous Epithelial / LPF 6-10 0 - 5   Mucus PRESENT   Pregnancy, urine POC     Status: None   Collection Time: 10/01/18  5:38 PM  Result Value Ref Range   Preg Test, Ur NEGATIVE NEGATIVE   ____________________________________________ ____________________________________________  RADIOLOGY  I personally reviewed all radiographic images ordered to evaluate for the above acute complaints and reviewed radiology reports and findings.  These findings were personally discussed with the patient.  Please see medical record for  radiology report.  ____________________________________________   PROCEDURES  Procedure(s) performed:  Procedures    Critical Care performed: no ____________________________________________   INITIAL IMPRESSION / ASSESSMENT AND PLAN / ED COURSE  Pertinent labs & imaging results that were available during my care of the patient were reviewed by me and considered in my medical decision making (see chart for details).   DDX: Constipation, colitis, ovarian cyst or torsion, pregnancy, UTI, stone  Denise Lopez is a 28 y.o. who  presents to the ED with symptoms as described above.  Patient afebrile and hemodynamically stable.  Her abdominal exam is soft and benign.  This not consistent with appendicitis, colitis, torsion or acute ovarian pathology.  She is not pregnant.  Blood work is reassuring.  X-ray shows evidence of moderate stool burden which is more consistent with the patient's presenting complaint.  Will be started on laxatives.  Discussed signs and symptoms for which she should return to the ER.      As part of my medical decision making, I reviewed the following data within the electronic MEDICAL RECORD NUMBER Nursing notes reviewed and incorporated, Labs reviewed, notes from prior ED visits and [place] Controlled Substance Database   ____________________________________________   FINAL CLINICAL IMPRESSION(S) / ED DIAGNOSES  Final diagnoses:  Left lower quadrant abdominal pain      NEW MEDICATIONS STARTED DURING THIS VISIT:  Current Discharge Medication List    START taking these medications   Details  polyethylene glycol (MIRALAX / GLYCOLAX) packet Take 17 g by mouth daily. Qty: 30 each, Refills: 1         Note:  This document was prepared using Dragon voice recognition software and may include unintentional dictation errors.    Willy Eddy, MD 10/01/18 Corky Crafts

## 2018-12-04 LAB — HEPATITIS B SURFACE ANTIGEN: Hepatitis B Surface Ag: NONREACTIVE

## 2018-12-04 LAB — HM HIV SCREENING LAB: HM HIV Screening: NEGATIVE

## 2018-12-04 LAB — HM HEPATITIS C SCREENING LAB: HM Hepatitis Screen: NEGATIVE

## 2019-05-11 ENCOUNTER — Emergency Department: Payer: Self-pay

## 2019-05-11 ENCOUNTER — Emergency Department
Admission: EM | Admit: 2019-05-11 | Discharge: 2019-05-11 | Disposition: A | Discharge status: Home / Self Care | Payer: Self-pay | Attending: Emergency Medicine | Admitting: Emergency Medicine

## 2019-05-11 ENCOUNTER — Encounter: Payer: Self-pay | Admitting: Emergency Medicine

## 2019-05-11 ENCOUNTER — Other Ambulatory Visit: Payer: Self-pay

## 2019-05-11 DIAGNOSIS — K2100 Gastro-esophageal reflux disease with esophagitis, without bleeding: Secondary | ICD-10-CM

## 2019-05-11 DIAGNOSIS — J45909 Unspecified asthma, uncomplicated: Secondary | ICD-10-CM | POA: Insufficient documentation

## 2019-05-11 DIAGNOSIS — R0789 Other chest pain: Secondary | ICD-10-CM | POA: Insufficient documentation

## 2019-05-11 LAB — CBC
HCT: 35.1 % — ABNORMAL LOW (ref 36.0–46.0)
Hemoglobin: 11.5 g/dL — ABNORMAL LOW (ref 12.0–15.0)
MCH: 26.9 pg (ref 26.0–34.0)
MCHC: 32.8 g/dL (ref 30.0–36.0)
MCV: 82.2 fL (ref 80.0–100.0)
Platelets: 222 10*3/uL (ref 150–400)
RBC: 4.27 MIL/uL (ref 3.87–5.11)
RDW: 16.4 % — ABNORMAL HIGH (ref 11.5–15.5)
WBC: 7.5 10*3/uL (ref 4.0–10.5)
nRBC: 0 % (ref 0.0–0.2)

## 2019-05-11 LAB — BASIC METABOLIC PANEL
Anion gap: 7 (ref 5–15)
BUN: 10 mg/dL (ref 6–20)
CO2: 25 mmol/L (ref 22–32)
Calcium: 8.6 mg/dL — ABNORMAL LOW (ref 8.9–10.3)
Chloride: 110 mmol/L (ref 98–111)
Creatinine, Ser: 0.77 mg/dL (ref 0.44–1.00)
GFR calc Af Amer: >60 mL/min (ref >60)
GFR calc non Af Amer: >60 mL/min (ref >60)
Glucose, Bld: 104 mg/dL — ABNORMAL HIGH (ref 70–99)
Potassium: 3.8 mmol/L (ref 3.5–5.1)
Sodium: 142 mmol/L (ref 135–145)

## 2019-05-11 LAB — TROPONIN I (HIGH SENSITIVITY): Troponin I (High Sensitivity): <2 ng/L (ref <18)

## 2019-05-11 MED ORDER — PANTOPRAZOLE SODIUM 20 MG PO TBEC: 20.0000 mg | DELAYED_RELEASE_TABLET | Freq: Every day | ORAL | 1 refills | Status: DC

## 2019-05-11 MED ORDER — SODIUM CHLORIDE 0.9% FLUSH: 3.0000 mL | Freq: Once | INTRAVENOUS | Status: DC

## 2019-05-11 MED ORDER — ALUM & MAG HYDROXIDE-SIMETH 200-200-20 MG/5ML PO SUSP
30.0000 mL | Freq: Once | ORAL | Status: AC
  Administered 2019-05-11: 30 mL via ORAL
  Filled 2019-05-11: qty 30

## 2019-05-11 MED ORDER — LIDOCAINE VISCOUS HCL 2 % MT SOLN
15.0000 mL | Freq: Once | OROMUCOSAL | Status: AC
  Administered 2019-05-11: 18:00:00 15 mL via ORAL
  Filled 2019-05-11: qty 15

## 2019-05-11 NOTE — ED Provider Notes (Signed)
[facility] Emergency Department Provider Note   ____________________________________________    I have reviewed the triage vital signs and the nursing notes.   HISTORY  Chief Complaint Chest Pain     HPI Denise Lopez is a 28 y.o. female who presents with complaints of chest discomfort.  Patient notes over the last 24 hours she has had a burning discomfort in her lower chest, upper abdomen.  She reports this started overnight after eating pizza.  She is never had this before.  No history of acid reflux.  No shortness of breath no pleurisy.  No diaphoresis or dizziness.  Has not take anything for this besides some ibuprofen which did not help.  No calf pain or swelling.  Past Medical History:  Diagnosis Date  . Anxiety   . Asthma   . Seizures (HCC)   . Syncope and collapse     Patient Active Problem List   Diagnosis Date Noted  . Seizure (HCC) 04/12/2018    Past Surgical History:  Procedure Laterality Date  . TONSILLECTOMY      Prior to Admission medications   Medication Sig Start Date End Date Taking? Authorizing Provider  levETIRAcetam (KEPPRA) 750 MG tablet Take 1 tablet (750 mg total) by mouth 2 (two) times daily. Patient not taking: Reported on 05/11/2019 05/05/18   Emily FilbertWilliams, Jonathan E, MD  ondansetron (ZOFRAN ODT) 4 MG disintegrating tablet Take 1 tablet (4 mg total) by mouth every 8 (eight) hours as needed for nausea or vomiting. Patient not taking: Reported on 05/11/2019 05/05/18   Emily FilbertWilliams, Jonathan E, MD  pantoprazole (PROTONIX) 20 MG tablet Take 1 tablet (20 mg total) by mouth daily. 05/11/19 05/10/20  Jene EveryKinner, Iker Nuttall, MD  polyethylene glycol [facility](MIRALAX / Ethelene HalGLYCOLAX) packet Take 17 g by mouth daily. Patient not taking: Reported on 05/11/2019 10/01/18   Willy Eddyobinson, Patrick, MD     Allergies Dust mite extract  Family History  Problem Relation Age of Onset  . Anxiety disorder Mother   . Seizures Cousin   . Sudden Cardiac Death Neg Hx     Social History Social History   Tobacco Use  . Smoking status: Never Smoker  . Smokeless tobacco: Never Used  Substance Use Topics  . Alcohol use: Yes    Alcohol/week: 1.0 standard drinks    Types: 1 Shots of liquor per week    Comment: 3 mixed drinks per month  . Drug use: No    Review of Systems  Constitutional: No fever/chills Eyes: No visual changes.  ENT: No sore throat. Cardiovascular: As above Respiratory: Denies shortness of breath. Gastrointestinal:  No nausea, no vomiting.   Genitourinary: Negative for dysuria. Musculoskeletal: Negative for back pain. Skin: Negative for rash. Neurological: Negative for headaches    ____________________________________________   PHYSICAL EXAM:  VITAL SIGNS: ED Triage Vitals  Enc Vitals Group     BP 05/11/19 1602 119/69     Pulse Rate 05/11/19 1602 86     Resp 05/11/19 1602 18     Temp 05/11/19 1602 98.8 F (37.1 C)     Temp Source 05/11/19 1602 Oral     SpO2 05/11/19 1602 100 %     Weight 05/11/19 1605 58.5 kg (129 lb)     Height 05/11/19 1605 1.727 m (5\' 8" )     Head Circumference --      Peak Flow --      Pain Score 05/11/19 1605 6     Pain Loc --  Pain Edu? --      Excl. in [place]? --     Constitutional: Alert and oriented. Eyes: Conjunctivae are normal.   Nose: No congestion/rhinnorhea. Mouth/Throat: Mucous membranes are moist.    Cardiovascular: Normal rate, regular rhythm. Grossly normal heart sounds.  Good peripheral circulation.  No chest wall tenderness, no rash Respiratory: Normal respiratory effort.  No retractions. Lungs CTAB. Gastrointestinal: Soft and nontender. No distention.  No CVA tenderness, reassuring exam  Musculoskeletal: No lower extremity tenderness nor edema.  Warm and well perfused Neurologic:  Normal speech and language. No gross focal neurologic deficits are appreciated.  Skin:  Skin is warm, dry and intact. No rash noted. Psychiatric: Mood and affect are normal. Speech and  behavior are normal.  ____________________________________________   LABS (all labs ordered are listed, but only abnormal results are displayed)  Labs Reviewed  BASIC METABOLIC PANEL - Abnormal; Notable for the following components:      Result Value   Glucose, Bld 104 (*)    Calcium 8.6 (*)    All other components within normal limits  CBC - Abnormal; Notable for the following components:   Hemoglobin 11.5 (*)    HCT 35.1 (*)    RDW 16.4 (*)    All other components within normal limits  POC URINE PREG, ED  TROPONIN I (HIGH SENSITIVITY)   ____________________________________________  EKG  ED ECG REPORT I, Lavonia Drafts, the attending physician, personally viewed and interpreted this ECG.  Date: 05/11/2019  Rhythm: normal sinus rhythm QRS Axis: normal Intervals: normal ST/T Wave abnormalities: normal Narrative Interpretation: no evidence of acute ischemia  ____________________________________________  RADIOLOGY  Chest x-ray unremarkable ____________________________________________   PROCEDURES  Procedure(s) performed: No  Procedures   Critical Care performed: No ____________________________________________   INITIAL IMPRESSION / ASSESSMENT AND PLAN / ED COURSE  Pertinent labs & imaging results that were available during my care of the patient were reviewed by me and considered in my medical decision making (see chart for details).  Patient well-appearing and in no acute distress, vital signs and exam are quite reassuring.  EKG is unremarkable.  Lab work has returned, normal high-sensitivity troponin.  Chest x-ray is normal.  Strongly suspect gastritis/GERD, will try GI cocktail will likely start on PPIs outpatient follow-up.   Patient had significant relief with GI cocktail discussed with her the need for outpatient follow-up, will start PPI, return precautions discussed    ____________________________________________   FINAL CLINICAL IMPRESSION(S)  / ED DIAGNOSES  Final diagnoses:  Atypical chest pain  Gastroesophageal reflux disease with esophagitis        Note:  This document was prepared using Dragon voice recognition software and may include unintentional dictation errors.   Lavonia Drafts, MD 05/11/19 [phone]

## 2019-05-11 NOTE — ED Notes (Signed)
Pt c/o centralized chest pain that started last night. She describes the pain as a heaviness. Pt took medication at home that did not relieve the pain. Denies fever, SOB, or radiation of pain. Pt NSR on the monitor. V/S WNL. NAD at time

## 2019-05-11 NOTE — ED Notes (Signed)
Patient transported to X-ray 

## 2019-05-11 NOTE — ED Triage Notes (Signed)
Pt to ED with c/o of central chest pain that started last night that she describes as tightness that increases with movement. Pt denies N/V, dizziness or lightheadedness.

## 2019-10-08 DIAGNOSIS — N898 Other specified noninflammatory disorders of vagina: Secondary | ICD-10-CM

## 2019-12-30 ENCOUNTER — Ambulatory Visit: Payer: Self-pay | Admitting: Family Medicine

## 2019-12-30 ENCOUNTER — Other Ambulatory Visit: Payer: Self-pay

## 2019-12-30 DIAGNOSIS — Z975 Presence of (intrauterine) contraceptive device: Secondary | ICD-10-CM | POA: Insufficient documentation

## 2019-12-30 DIAGNOSIS — A599 Trichomoniasis, unspecified: Secondary | ICD-10-CM

## 2019-12-30 DIAGNOSIS — Z202 Contact with and (suspected) exposure to infections with a predominantly sexual mode of transmission: Secondary | ICD-10-CM

## 2019-12-30 DIAGNOSIS — Z113 Encounter for screening for infections with a predominantly sexual mode of transmission: Secondary | ICD-10-CM

## 2019-12-30 MED ORDER — AZITHROMYCIN 500 MG PO TABS
1000.0000 mg | ORAL_TABLET | Freq: Once | ORAL | Status: AC
  Administered 2019-12-30: 1000 mg via ORAL

## 2019-12-30 MED ORDER — METRONIDAZOLE 500 MG PO TABS: 2000.0000 mg | ORAL_TABLET | Freq: Once | ORAL | 0 refills | Status: AC

## 2019-12-30 MED ORDER — METRONIDAZOLE 500 MG PO TABS: 2000.0000 mg | ORAL_TABLET | Freq: Once | ORAL | 0 refills | Status: DC

## 2019-12-30 NOTE — Progress Notes (Signed)
[facility] Department STI clinic/screening visit  Subjective:  Denise Lopez is a 29 y.o. female being seen today for an STI screening visit. The patient reports they do not have symptoms.  Patient reports that they do not desire a pregnancy in the next year.   They reported they are not interested in discussing contraception today.  Patient's last menstrual period was 12/22/2019 (exact date).   Patient has the following medical conditions:   Patient Active Problem List   Diagnosis Date Noted  . Nexplanon in place 12/30/2019  . Seizure (HCC) 04/12/2018  . Vaginal cyst 02/12/2015    Chief Complaint  Patient presents with  . SEXUALLY TRANSMITTED DISEASE    HPI  Patient reports new sx in the last 2 weeks. Started having itching and reports simultaneous change in soap and laundry detergent. Reports the itching has been consistent and not improving. No aggravating or alleviating. Progressed to having vaginal discharge and recently attempted to have sex but it was very painful and they had to stop. She reports vaginal irritation as well-- present for about 1 week. Non worsening but not improving.  Partner is here for screening today as well.   See flowsheet for further details and programmatic requirements.    The following portions of the patient's history were reviewed and updated as appropriate: allergies, current medications, past medical history, past social history, past surgical history and problem list.  Objective:  There were no vitals filed for this visit.  Physical Exam Vitals and nursing note reviewed.  Constitutional:      Appearance: Normal appearance.  HENT:     Head: Normocephalic and atraumatic.     Mouth/Throat:     Mouth: Mucous membranes are moist.     Pharynx: Oropharynx is clear. No oropharyngeal exudate or posterior oropharyngeal erythema.  Pulmonary:     Effort: Pulmonary effort is normal.  Abdominal:     General: Abdomen is flat.   Palpations: There is no mass.     Tenderness: There is no abdominal tenderness. There is no rebound.  Genitourinary:    General: Normal vulva.     Exam position: Lithotomy position.     Pubic Area: No rash or pubic lice.      Labia:        Right: No rash or lesion.        Left: No rash or lesion.      Vagina: Vaginal discharge and bleeding present. No erythema or lesions.     Cervix: No cervical motion tenderness, discharge, friability, lesion or erythema.     Uterus: Normal.      Adnexa: Right adnexa normal and left adnexa normal.     Rectum: Normal.     Comments: Mild blood in vaginal vault. Uterus is non-tender Lymphadenopathy:     Head:     Right side of head: No preauricular or posterior auricular adenopathy.     Left side of head: No preauricular or posterior auricular adenopathy.     Cervical: No cervical adenopathy.     Upper Body:     Right upper body: No supraclavicular or axillary adenopathy.     Left upper body: No supraclavicular or axillary adenopathy.     Lower Body: No right inguinal adenopathy. No left inguinal adenopathy.  Skin:    General: Skin is warm and dry.     Findings: No rash.  Neurological:     Mental Status: She is alert and oriented to person, place, and time.  Assessment and Plan:  Denise Lopez is a 29 y.o. female presenting to the [facility] Department for STI screening  1. Screening examination for venereal disease Patient accepted all screenings including oral, vaginal CT/GC and declines bloodwork for HIV/RPR.  Patient meets criteria for HepB screening? Yes. Ordered? No - declines bloodwork today Patient meets criteria for HepC screening? Yes. Ordered? No - declines bloodwork  Treat wet prep per standing order-- Wet mount showed trich and partner informed her that he was treated for NGU. She will receive treatment for trich and for contact to NGU today Discussed time line for [facility] results and that patient will be  called with positive results and encouraged patient to call if she had not heard in 2 weeks.  Counseled to return or seek care for continued or worsening symptoms Recommended condom use with all sex  Patient is currently using *Nexplanon to prevent pregnancy.    2. Nexplanon in place Likes device  3. Immunizations - Td is up to date - Reviewed and discussed COVID 19 vaccine   Return if symptoms worsen or fail to improve.  No future appointments.  Caren Macadam, MD

## 2019-12-30 NOTE — Progress Notes (Signed)
Wet mount reviewed and per Dr. Alvester Morin, treat for trich per standing order (as does not need BV treatment). Per client, partner is currently here in another clinic and has texted her he has NGU. Client treated as contact to NGU as well. Jossie Ng, RN

## 2019-12-31 LAB — WET PREP FOR TRICH, YEAST, CLUE
Trichomonas Exam: POSITIVE — AB
Yeast Exam: NEGATIVE

## 2020-01-01 ENCOUNTER — Encounter: Payer: Self-pay | Admitting: Family Medicine

## 2020-04-05 IMAGING — CT CT CERVICAL SPINE W/O CM
4 of 7 series · 14 of 33 positions shown, 15 images · non-contrast
Comparison: 12/23/2016

CLINICAL DATA: Witnessed seizure 2 minutes period headache and
right-sided pain.

EXAM:
CT HEAD WITHOUT CONTRAST
CT CERVICAL SPINE WITHOUT CONTRAST
TECHNIQUE: Multidetector CT imaging of the head and cervical spine was
performed following the standard protocol without intravenous
contrast. Multiplanar CT image reconstructions of the cervical spine
were also generated.

[Series 4: coronal soft tissue · coronal · 0.27mm/px · 2 of 63 slices shown]
[im 21/63  bone]
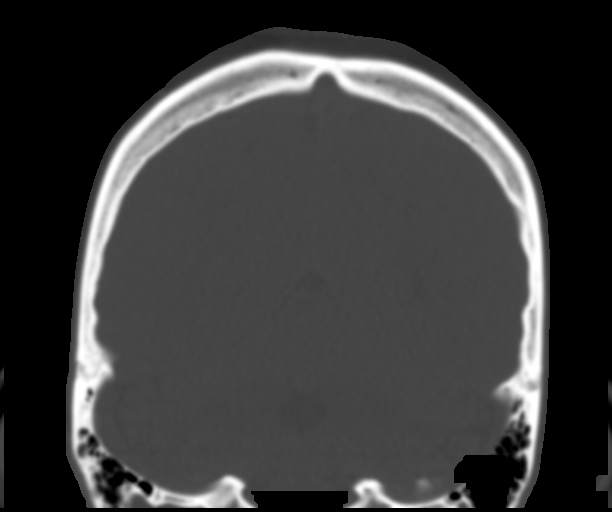
[im 42/63  bone]
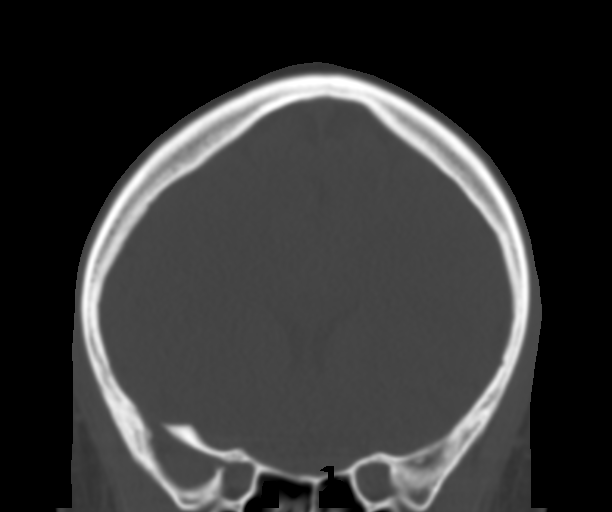

[Series 7: c spine soft · axial · 0.28mm/px · z∈[+224,+320]mm · 4 of 80 slices shown]
[im 16/80  soft-tissue]
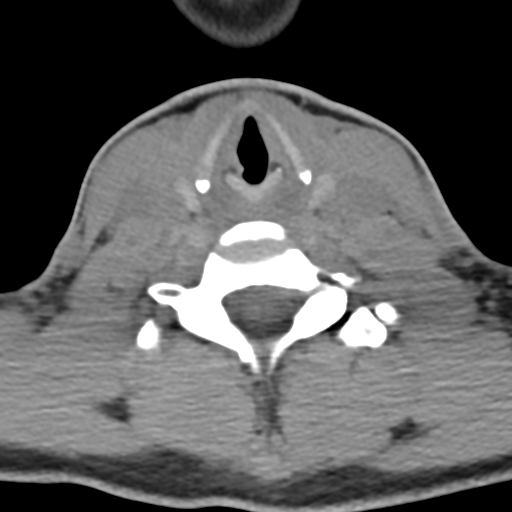
[im 32/80  soft-tissue]
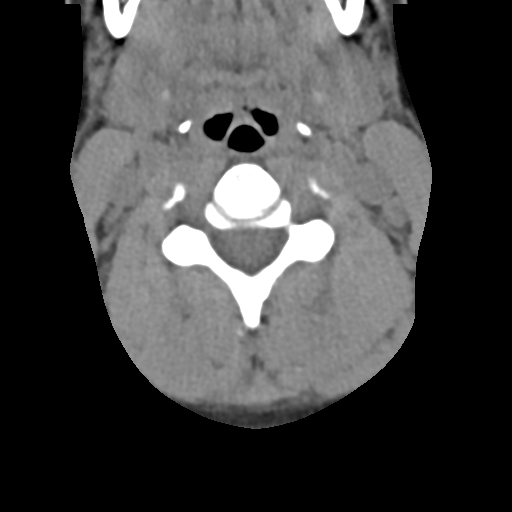
[im 48/80  soft-tissue]
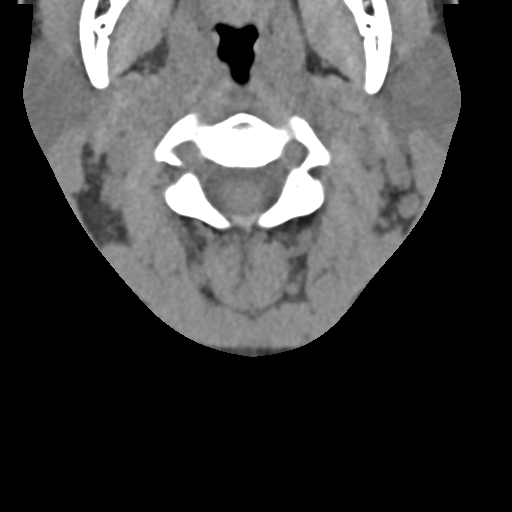
[im 64/80  soft-tissue]
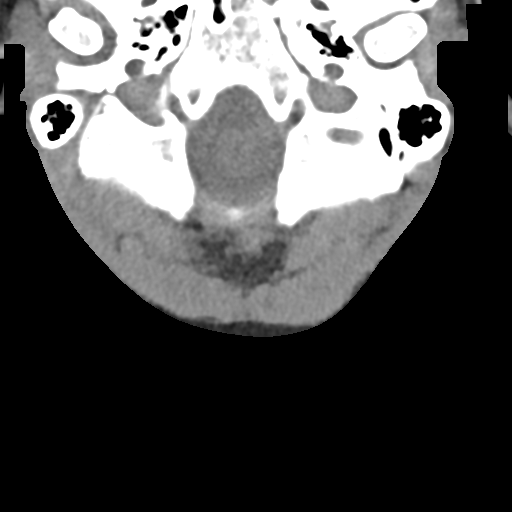

[Series 8: sagittal bone · sagittal · 0.23mm/px · 4 of 43 slices shown]
[im 9/43  bone]
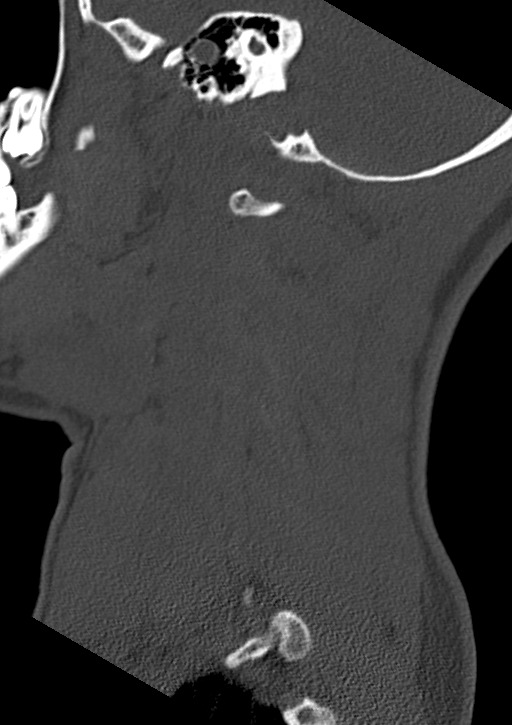
[im 17/43  bone]
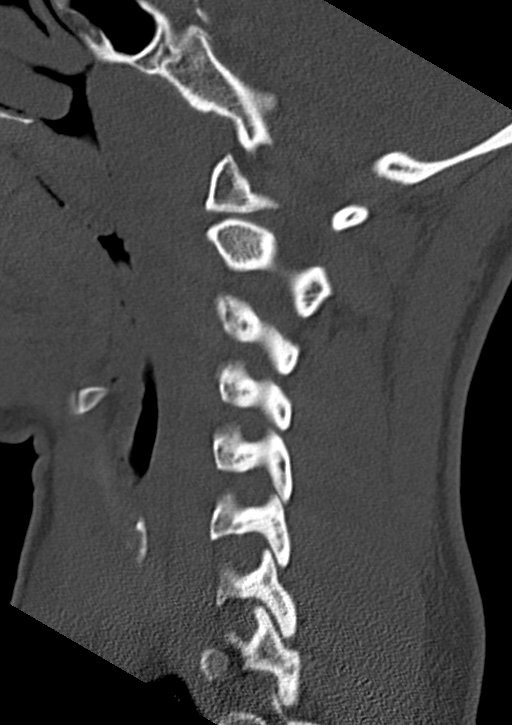
[im 26/43  bone]
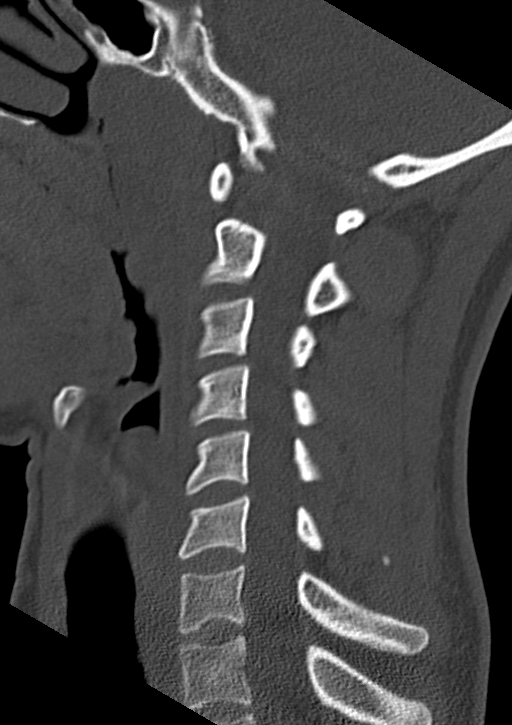
[im 34/43  bone]
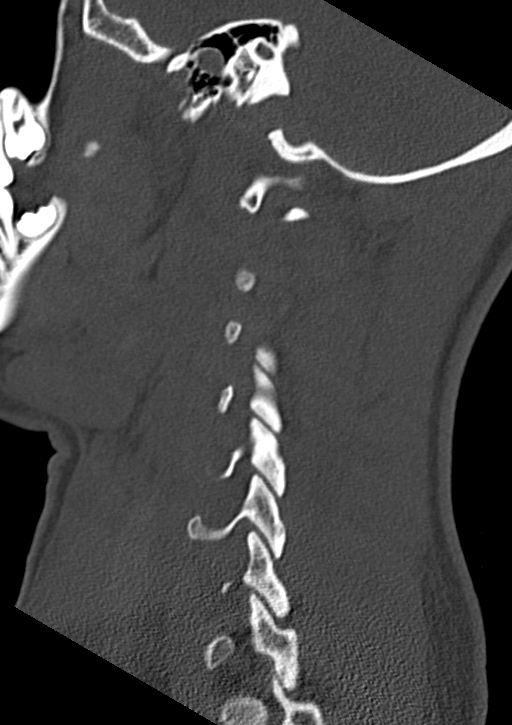

[Series 10: orthogonal bone · axial · 0.23mm/px · z∈[+200,+281]mm · 4 of 78 slices shown, 5 images]
[im 16/78  soft-tissue]
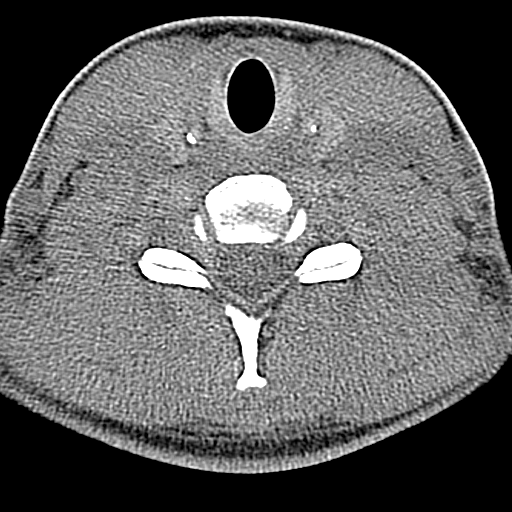
[im 16/78  bone]
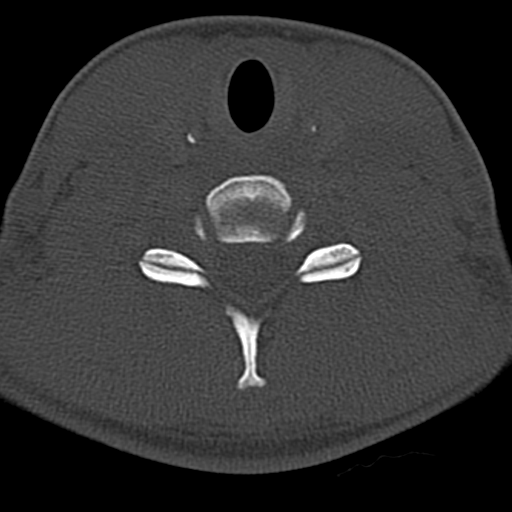
[im 31/78  bone]
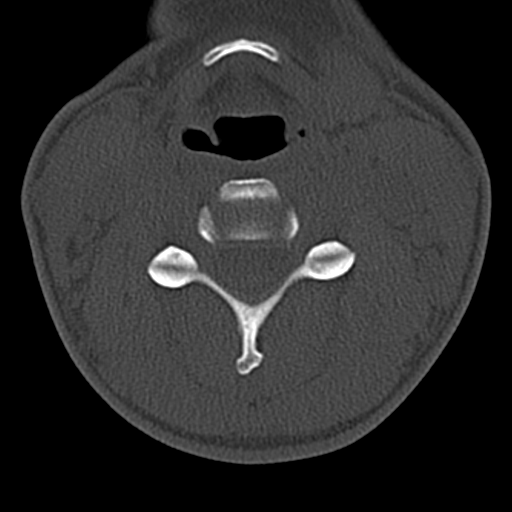
[im 47/78  bone]
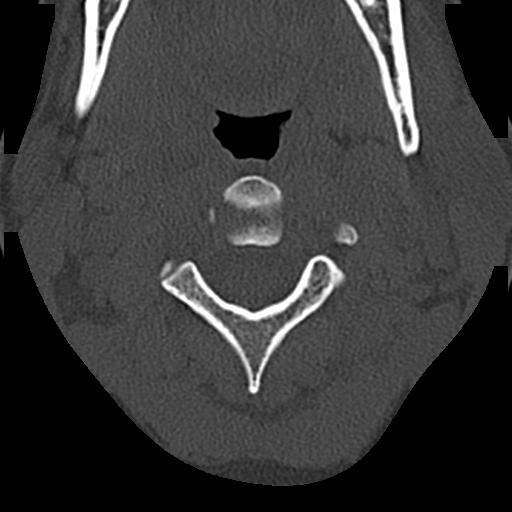
[im 62/78  bone]
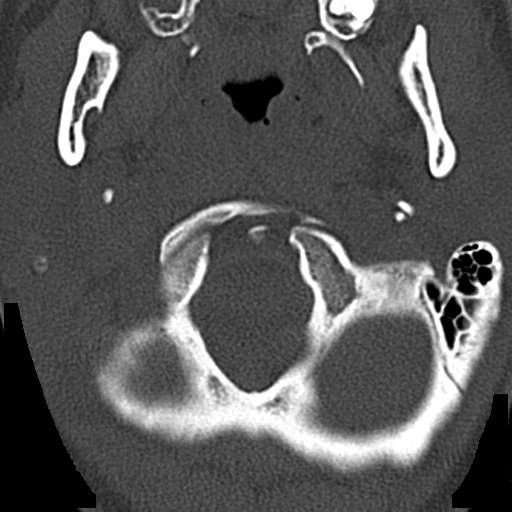

[14 of 33 positions shown; findings below may reference images not displayed]

FINDINGS: CT HEAD FINDINGS

Brain: The brainstem, cerebellum, cerebral peduncles, thalami, basal
ganglia, basilar cisterns, and ventricular system appear within
normal limits. No intracranial hemorrhage, mass lesion, or acute
CVA.

Vascular: Unremarkable

Skull: Unremarkable

Sinuses/Orbits: Mild chronic ethmoid and sphenoid sinusitis.

Other: No supplemental non-categorized findings.

CT CERVICAL SPINE FINDINGS

Alignment: No vertebral subluxation is observed.

Skull base and vertebrae: No fracture or acute bony findings.

Soft tissues and spinal canal: Scattered small internal jugular
lymph nodes are likely incidental.

Disc levels:  No osseous impingement.

Upper chest: Unremarkable

Other: No supplemental non-categorized findings.
IMPRESSION: 1. No significant intracranial abnormality and no significant
cervical spine abnormality.
2. Mild chronic ethmoid and sphenoid sinusitis.

## 2020-04-20 DIAGNOSIS — Z20822 Contact with and (suspected) exposure to covid-19: Secondary | ICD-10-CM | POA: Diagnosis not present

## 2020-05-12 DIAGNOSIS — Z20822 Contact with and (suspected) exposure to covid-19: Secondary | ICD-10-CM | POA: Diagnosis not present

## 2020-07-01 DIAGNOSIS — Z20822 Contact with and (suspected) exposure to covid-19: Secondary | ICD-10-CM | POA: Diagnosis not present

## 2020-07-06 DIAGNOSIS — Z20822 Contact with and (suspected) exposure to covid-19: Secondary | ICD-10-CM | POA: Diagnosis not present

## 2020-08-31 DIAGNOSIS — Z111 Encounter for screening for respiratory tuberculosis: Secondary | ICD-10-CM | POA: Diagnosis not present

## 2020-11-21 ENCOUNTER — Emergency Department
Admission: EM | Admit: 2020-11-21 | Discharge: 2020-11-21 | Disposition: A | Discharge status: Home / Self Care | Payer: Medicaid Other | Attending: Emergency Medicine | Admitting: Emergency Medicine

## 2020-11-21 ENCOUNTER — Emergency Department: Payer: Medicaid Other

## 2020-11-21 ENCOUNTER — Other Ambulatory Visit: Payer: Self-pay

## 2020-11-21 DIAGNOSIS — J45909 Unspecified asthma, uncomplicated: Secondary | ICD-10-CM | POA: Diagnosis not present

## 2020-11-21 DIAGNOSIS — R0602 Shortness of breath: Secondary | ICD-10-CM | POA: Diagnosis not present

## 2020-11-21 DIAGNOSIS — J4521 Mild intermittent asthma with (acute) exacerbation: Secondary | ICD-10-CM | POA: Diagnosis not present

## 2020-11-21 DIAGNOSIS — Z20822 Contact with and (suspected) exposure to covid-19: Secondary | ICD-10-CM | POA: Insufficient documentation

## 2020-11-21 DIAGNOSIS — J209 Acute bronchitis, unspecified: Secondary | ICD-10-CM

## 2020-11-21 DIAGNOSIS — R079 Chest pain, unspecified: Secondary | ICD-10-CM | POA: Diagnosis not present

## 2020-11-21 DIAGNOSIS — R110 Nausea: Secondary | ICD-10-CM | POA: Diagnosis not present

## 2020-11-21 DIAGNOSIS — R059 Cough, unspecified: Secondary | ICD-10-CM | POA: Diagnosis not present

## 2020-11-21 LAB — CBC
HCT: 41.6 % (ref 36.0–46.0)
Hemoglobin: 13.7 g/dL (ref 12.0–15.0)
MCH: 27.2 pg (ref 26.0–34.0)
MCHC: 32.9 g/dL (ref 30.0–36.0)
MCV: 82.7 fL (ref 80.0–100.0)
Platelets: 253 10*3/uL (ref 150–400)
RBC: 5.03 MIL/uL (ref 3.87–5.11)
RDW: 14.6 % (ref 11.5–15.5)
WBC: 17.3 10*3/uL — ABNORMAL HIGH (ref 4.0–10.5)
nRBC: 0 % (ref 0.0–0.2)

## 2020-11-21 LAB — BASIC METABOLIC PANEL
Anion gap: 8 (ref 5–15)
BUN: 9 mg/dL (ref 6–20)
CO2: 26 mmol/L (ref 22–32)
Calcium: 9.4 mg/dL (ref 8.9–10.3)
Chloride: 105 mmol/L (ref 98–111)
Creatinine, Ser: 0.64 mg/dL (ref 0.44–1.00)
GFR, Estimated: >60 mL/min (ref >60)
Glucose, Bld: 105 mg/dL — ABNORMAL HIGH (ref 70–99)
Potassium: 4 mmol/L (ref 3.5–5.1)
Sodium: 139 mmol/L (ref 135–145)

## 2020-11-21 LAB — RESP PANEL BY RT-PCR (FLU A&B, COVID) ARPGX2
Influenza A by PCR: NEGATIVE
Influenza B by PCR: NEGATIVE
SARS Coronavirus 2 by RT PCR: NEGATIVE

## 2020-11-21 LAB — TROPONIN I (HIGH SENSITIVITY): Troponin I (High Sensitivity): 3 ng/L (ref <18)

## 2020-11-21 LAB — POC URINE PREG, ED: Preg Test, Ur: NEGATIVE

## 2020-11-21 MED ORDER — PREDNISONE 10 MG (21) PO TBPK: ORAL_TABLET | ORAL | 0 refills | Status: DC

## 2020-11-21 MED ORDER — AZITHROMYCIN 250 MG PO TABS: ORAL_TABLET | ORAL | 0 refills | Status: DC

## 2020-11-21 MED ORDER — IPRATROPIUM-ALBUTEROL 0.5-2.5 (3) MG/3ML IN SOLN
3.0000 mL | Freq: Once | RESPIRATORY_TRACT | Status: AC
  Administered 2020-11-21: 3 mL via RESPIRATORY_TRACT
  Filled 2020-11-21: qty 3

## 2020-11-21 MED ORDER — ALBUTEROL SULFATE HFA 108 (90 BASE) MCG/ACT IN AERS
2.0000 | INHALATION_SPRAY | Freq: Four times a day (QID) | RESPIRATORY_TRACT | Status: DC
  Administered 2020-11-21: 2 via RESPIRATORY_TRACT
  Filled 2020-11-21 (×2): qty 6.7

## 2020-11-21 MED ORDER — DEXAMETHASONE SODIUM PHOSPHATE 10 MG/ML IJ SOLN
10.0000 mg | Freq: Once | INTRAMUSCULAR | Status: AC
  Administered 2020-11-21: 10 mg via INTRAMUSCULAR
  Filled 2020-11-21: qty 1

## 2020-11-21 MED ORDER — IPRATROPIUM-ALBUTEROL 0.5-2.5 (3) MG/3ML IN SOLN: 3.0000 mL | Freq: Four times a day (QID) | RESPIRATORY_TRACT | 1 refills | Status: AC | PRN

## 2020-11-21 NOTE — Discharge Instructions (Signed)
Follow-up with your regular doctor if not improving in 2 to 3 days.  Return emergency department worsening.  Use medications as prescribed. 

## 2020-11-21 NOTE — ED Notes (Signed)
Pharmacy messaged regarding missing inhaler x2 with no response. Calling main pharmacy now to check on status of inhaler.

## 2020-11-21 NOTE — ED Provider Notes (Addendum)
[facility] Emergency Department Provider Note  ____________________________________________   Event Date/Time   First MD Initiated Contact with Patient 11/21/20 1748     (approximate)  I have reviewed the triage vital signs and the nursing notes.   HISTORY  Chief Complaint Shortness of Breath    HPI Denise Lopez is a 30 y.o. female with history of asthma presents with shortness of breath and productive cough.  Some centralized chest pain and nausea.  Patient states she has had difficulty breathing and going up steps becomes very short of breath.  Has not used an inhaler in several years but states she did have asthma as a child.  She denies fever.    Past Medical History:  Diagnosis Date  . Anxiety   . Asthma   . Seizures (HCC)   . Syncope and collapse     Patient Active Problem List   Diagnosis Date Noted  . Nexplanon in place 12/30/2019  . Seizure (HCC) 04/12/2018  . Vaginal cyst 02/12/2015    Past Surgical History:  Procedure Laterality Date  . TONSILLECTOMY      Prior to Admission medications   Medication Sig Start Date End Date Taking? Authorizing Provider  azithromycin (ZITHROMAX Z-PAK) 250 MG tablet 2 pills today then 1 pill a day for 4 days 11/21/20  Yes Fisher, Roselyn Bering, PA-C  ipratropium-albuterol (DUONEB) 0.5-2.5 (3) MG/3ML SOLN Take 3 mLs by nebulization every 6 (six) hours as needed. 11/21/20  Yes Fisher, Roselyn Bering, PA-C  predniSONE (STERAPRED UNI-PAK 21 TAB) 10 MG (21) TBPK tablet Take 6 pills on day one then decrease by 1 pill each day 11/21/20  Yes Fisher, Roselyn Bering, PA-C  etonogestrel (NEXPLANON) 68 MG IMPL implant 1 each by Subdermal route once. 02/06/19   Tessa Lerner, NP  levETIRAcetam (KEPPRA) 750 MG tablet Take 1 tablet (750 mg total) by mouth 2 (two) times daily. Patient not taking: Reported on 05/11/2019 05/05/18   Emily Filbert, MD  ondansetron (ZOFRAN ODT) 4 MG disintegrating tablet Take 1 tablet (4 mg  total) by mouth every 8 (eight) hours as needed for nausea or vomiting. Patient not taking: Reported on 05/11/2019 05/05/18   Emily Filbert, MD  pantoprazole (PROTONIX) 20 MG tablet Take 1 tablet (20 mg total) by mouth daily. 05/11/19 05/10/20  Jene Every, MD  polyethylene glycol [facility] Ethelene Hal) packet Take 17 g by mouth daily. Patient not taking: Reported on 05/11/2019 10/01/18   Willy Eddy, MD    Allergies Dust mite extract  Family History  Problem Relation Age of Onset  . Anxiety disorder Mother   . Diabetes Mother   . Hypertension Mother   . Seizures Cousin   . Diabetes Maternal Grandfather   . Hypertension Maternal Grandfather   . Sudden Cardiac Death Neg Hx     Social History Social History   Tobacco Use  . Smoking status: Never Smoker  . Smokeless tobacco: Never Used  Vaping Use  . Vaping Use: Never used  Substance Use Topics  . Alcohol use: Yes    Alcohol/week: 1.0 standard drink    Types: 1 Shots of liquor per week    Comment: 3 mixed drinks per month  . Drug use: Yes    Types: Marijuana    Review of Systems  Constitutional: No fever/chills Eyes: No visual changes. ENT: No sore throat. Respiratory: Positive cough Cardiovascular: Positive chest pain Gastrointestinal: Denies abdominal pain Genitourinary: Negative for dysuria. Musculoskeletal: Negative for back pain.  Skin: Negative for rash. Psychiatric: no mood changes,     ____________________________________________   PHYSICAL EXAM:  VITAL SIGNS: ED Triage Vitals  Enc Vitals Group     BP 11/21/20 1714 123/72     Pulse Rate 11/21/20 1714 (!) 105     Resp 11/21/20 1714 (!) 24     Temp 11/21/20 1714 98.2 F (36.8 C)     Temp Source 11/21/20 1714 Oral     SpO2 11/21/20 1714 94 %     Weight 11/21/20 1715 111 lb (50.3 kg)     Height 11/21/20 1715 5\' 7"  (1.702 m)     Head Circumference --      Peak Flow --      Pain Score 11/21/20 1714 7     Pain Loc --      Pain Edu? --       Excl. in GC? --     Constitutional: Alert and oriented. Well appearing and in no acute distress. Eyes: Conjunctivae are normal.  Head: Atraumatic. Nose: No congestion/rhinnorhea. Mouth/Throat: Mucous membranes are moist.   Neck:  supple no lymphadenopathy noted Cardiovascular: Normal rate, regular rhythm. Heart sounds are normal Respiratory: Normal respiratory effort.  No retractions, lungs with diffuse wheezing bilaterally Abd: soft nontender bs normal all 4 quad GU: deferred Musculoskeletal: FROM all extremities, warm and well perfused Neurologic:  Normal speech and language.  Skin:  Skin is warm, dry and intact. No rash noted. Psychiatric: Mood and affect are normal. Speech and behavior are normal.  ____________________________________________   LABS (all labs ordered are listed, but only abnormal results are displayed)  Labs Reviewed  BASIC METABOLIC PANEL - Abnormal; Notable for the following components:      Result Value   Glucose, Bld 105 (*)    All other components within normal limits  CBC - Abnormal; Notable for the following components:   WBC 17.3 (*)    All other components within normal limits  RESP PANEL BY RT-PCR (FLU A&B, COVID) ARPGX2  POC URINE PREG, ED  TROPONIN I (HIGH SENSITIVITY)   ____________________________________________   ____________________________________________  RADIOLOGY  Chest x-ray  ____________________________________________   PROCEDURES  Procedure(s) performed: DuoNeb x2, Decadron 10 mg IM  Procedures    ____________________________________________   INITIAL IMPRESSION / ASSESSMENT AND PLAN / ED COURSE  Pertinent labs & imaging results that were available during my care of the patient were reviewed by me and considered in my medical decision making (see chart for details).   Patient is a 30 year old female with history of asthma presents with shortness of breath.  See HPI.  Physical exam shows patient appears stable.   Patient is able to talk in full sentences but has become winded.  Chest x-ray reviewed by me confirmed by radiology to be negative for pneumonia   CBC has elevated WBC of 17.3, basic metabolic panel is normal, troponins negative  Covid/flu test is pending  DuoNeb ordered, DuoNeb No. 2 ordered, Decadron ordered  Had a little relief after DuoNeb, she still has a lot of wheezing noted.  O2 saturation increased to 97% on room air.  Respiratory panel is negative for Covid or influenza  Patient had second DuoNeb.  She states she does feel better and her O2 saturation is increased to 100% on room air.  Anticipate patient to be discharged.  Care transferred to Dr. [id number] at this time.  We are awaiting the inhaler from pharmacy.  Patient will be discharged at that time.  She is  given a prescription for a Z-Pak, Sterapred, DuoNeb Nebules, and she was given albuterol inhaler here.  Strict instructions to return if worsening.  Denise Lopez was evaluated in Emergency Department on 11/21/2020 for the symptoms described in the history of present illness. She was evaluated in the context of the global COVID-19 pandemic, which necessitated consideration that the patient might be at risk for infection with the SARS-CoV-2 virus that causes COVID-19. Institutional protocols and algorithms that pertain to the evaluation of patients at risk for COVID-19 are in a state of rapid change based on information released by regulatory bodies including the CDC and federal and state organizations. These policies and algorithms were followed during the patient's care in the ED.    As part of my medical decision making, I reviewed the following data within the electronic MEDICAL RECORD NUMBER Nursing notes reviewed and incorporated, Labs reviewed , Old chart reviewed, Radiograph reviewed , Evaluated by EM attending , Notes from prior ED visits and [place] Controlled Substance  Database  ____________________________________________   FINAL CLINICAL IMPRESSION(S) / ED DIAGNOSES  Final diagnoses:  Acute bronchitis, unspecified organism  Mild intermittent asthma with acute exacerbation      NEW MEDICATIONS STARTED DURING THIS VISIT:  New Prescriptions   AZITHROMYCIN (ZITHROMAX Z-PAK) 250 MG TABLET    2 pills today then 1 pill a day for 4 days   IPRATROPIUM-ALBUTEROL (DUONEB) 0.5-2.5 (3) MG/3ML SOLN    Take 3 mLs by nebulization every 6 (six) hours as needed.   PREDNISONE (STERAPRED UNI-PAK 21 TAB) 10 MG (21) TBPK TABLET    Take 6 pills on day one then decrease by 1 pill each day     Note:  This document was prepared using Dragon voice recognition software and may include unintentional dictation errors.    Faythe Ghee, PA-C 11/21/20 2055    Faythe Ghee, PA-C 11/21/20 2055    Concha Se, MD 11/22/20 1210

## 2020-11-21 NOTE — ED Triage Notes (Addendum)
Pt via POV from home. Pt c/o SOB, productive cough, centralized CP, and nauseous. Pt states it started yesterday but it got worse today. Pt has a hx of asthma. Pt does not have any inhalers or nebulizer treatments at home. Pt states it does not feel like an asthma attack. Pt is A&Ox4 and NAD.

## 2021-04-11 DIAGNOSIS — Z20822 Contact with and (suspected) exposure to covid-19: Secondary | ICD-10-CM | POA: Diagnosis not present

## 2022-07-27 ENCOUNTER — Other Ambulatory Visit: Payer: Self-pay

## 2022-07-27 DIAGNOSIS — Z5321 Procedure and treatment not carried out due to patient leaving prior to being seen by health care provider: Secondary | ICD-10-CM | POA: Diagnosis not present

## 2022-07-27 DIAGNOSIS — L02414 Cutaneous abscess of left upper limb: Secondary | ICD-10-CM | POA: Insufficient documentation

## 2022-07-27 NOTE — ED Triage Notes (Signed)
Pt comes from home via POV c/o abscess under left arm. Pt states it started off "looking like a pimple" but Saturday it got bigger and started hurting. Pt states she put warm compress on it but did not help. Pt in NAD, denies CP, SOB

## 2022-07-28 ENCOUNTER — Emergency Department
Admission: EM | Admit: 2022-07-28 | Discharge: 2022-07-28 | Discharge status: Against Medical Advice | Payer: BC Managed Care – PPO | Attending: Emergency Medicine | Admitting: Emergency Medicine

## 2022-07-28 DIAGNOSIS — L02213 Cutaneous abscess of chest wall: Secondary | ICD-10-CM | POA: Diagnosis not present

## 2023-08-14 ENCOUNTER — Ambulatory Visit: Payer: BC Managed Care – PPO | Admitting: Physician Assistant

## 2023-09-21 ENCOUNTER — Ambulatory Visit: Payer: Medicaid Other | Admitting: Physician Assistant

## 2024-02-06 ENCOUNTER — Encounter: Payer: Self-pay | Admitting: Family Medicine

## 2024-02-06 ENCOUNTER — Ambulatory Visit: Admitting: Family Medicine

## 2024-02-06 VITALS — BP 110/71 | HR 82 | Temp 98.3°F | Ht 67.0 in | Wt 135.0 lb

## 2024-02-06 DIAGNOSIS — Z975 Presence of (intrauterine) contraceptive device: Secondary | ICD-10-CM | POA: Diagnosis not present

## 2024-02-06 DIAGNOSIS — J4520 Mild intermittent asthma, uncomplicated: Secondary | ICD-10-CM | POA: Insufficient documentation

## 2024-02-06 DIAGNOSIS — F411 Generalized anxiety disorder: Secondary | ICD-10-CM | POA: Insufficient documentation

## 2024-02-06 DIAGNOSIS — Z0000 Encounter for general adult medical examination without abnormal findings: Secondary | ICD-10-CM

## 2024-02-06 DIAGNOSIS — R569 Unspecified convulsions: Secondary | ICD-10-CM

## 2024-02-06 DIAGNOSIS — Z7689 Persons encountering health services in other specified circumstances: Secondary | ICD-10-CM | POA: Diagnosis not present

## 2024-02-06 NOTE — Progress Notes (Signed)
 New Patient Office Visit  Subjective   Patient ID: Denise Lopez, female    DOB: 03/07/1991  Age: 33 y.o. MRN: [id number]  CC:  Chief Complaint  Patient presents with   Establish Care    HPI Denise Lopez is a 33 year old female who presents to establish with [facility] at [facility].   CC: Patient here to establish care  Last PCP: UC or health dept  Specialists: neurology  PMHx: anxiety, asthma, seizures (2019) d/t stress  She does not have any concerns today.      02/06/2024    8:11 AM  GAD 7 : Generalized Anxiety Score  Nervous, Anxious, on Edge 0  Control/stop worrying 0  Worry too much - different things 0  Trouble relaxing 0  Restless 0  Easily annoyed or irritable 1  Afraid - awful might happen 0  Total GAD 7 Score 1  Anxiety Difficulty Not difficult at all       02/06/2024    8:11 AM  PHQ9 SCORE ONLY  PHQ-9 Total Score 3     Outpatient Encounter Medications as of 02/06/2024  Medication Sig   etonogestrel (NEXPLANON) 68 MG IMPL implant 1 each by Subdermal route once.   ipratropium-albuterol  (DUONEB) 0.5-2.5 (3) MG/3ML SOLN Take 3 mLs by nebulization every 6 (six) hours as needed.   ondansetron  (ZOFRAN  ODT) 4 MG disintegrating tablet Take 1 tablet (4 mg total) by mouth every 8 (eight) hours as needed for nausea or vomiting.   [DISCONTINUED] azithromycin  (ZITHROMAX  Z-PAK) 250 MG tablet 2 pills today then 1 pill a day for 4 days   [DISCONTINUED] levETIRAcetam  (KEPPRA ) 750 MG tablet Take 1 tablet (750 mg total) by mouth 2 (two) times daily. (Patient not taking: Reported on 02/06/2024)   [DISCONTINUED] pantoprazole  (PROTONIX ) 20 MG tablet Take 1 tablet (20 mg total) by mouth daily.   [DISCONTINUED] polyethylene glycol (MIRALAX  / GLYCOLAX ) packet Take 17 g by mouth daily. (Patient not taking: Reported on 05/11/2019)   [DISCONTINUED] predniSONE  (STERAPRED UNI-PAK 21 TAB) 10 MG (21) TBPK tablet Take 6 pills on day one then decrease by 1 pill each  day (Patient not taking: Reported on 02/06/2024)   No facility-administered encounter medications on file as of 02/06/2024.    Patient Active Problem List   Diagnosis Date Noted   Nexplanon in place 12/30/2019   Seizure (HCC) 04/12/2018   Vaginal cyst 02/12/2015   Past Medical History:  Diagnosis Date   Anxiety    Asthma    Seizures (HCC)    Syncope and collapse    Past Surgical History:  Procedure Laterality Date   TONSILLECTOMY     Family History  Problem Relation Age of Onset   Anxiety disorder Mother    Diabetes Mother    Hypertension Mother    Seizures Cousin    Diabetes Maternal Grandfather    Hypertension Maternal Grandfather    Sudden Cardiac Death Neg Hx    Social History   Socioeconomic History   Marital status: Single    Spouse name: Not on file   Number of children: Not on file   Years of education: 12   Highest education level: 12th grade  Occupational History   Occupation: works for [employer]  Tobacco Use   Smoking status: Never   Smokeless tobacco: Never  Vaping Use   Vaping status: Never Used  Substance and Sexual Activity   Alcohol use: Yes    Alcohol/week: 1.0 standard drink of alcohol  Types: 1 Shots of liquor per week    Comment: 3 mixed drinks per month   Drug use: Yes    Types: Marijuana   Sexual activity: Yes    Birth control/protection: Implant  Other Topics Concern   Not on file  Social History Narrative   None   Social Drivers of Health   Financial Resource Strain: Low Risk  (04/13/2018)   Overall Financial Resource Strain (CARDIA)    Difficulty of Paying Living Expenses: Not hard at all  Food Insecurity: Unknown (04/13/2018)   Hunger Vital Sign    Worried About Running Out of Food in the Last Year: Patient declined    Ran Out of Food in the Last Year: Patient declined  Transportation Needs: Unknown (04/13/2018)   PRAPARE - Transportation    Lack of Transportation (Medical): Patient declined    Lack of Transportation  (Non-Medical): Patient declined  Physical Activity: Sufficiently Active (04/13/2018)   Exercise Vital Sign    Days of Exercise per Week: 5 days    Minutes of Exercise per Session: 30 min  Stress: No Stress Concern Present (04/13/2018)   [employer] of Occupational Health - Occupational Stress Questionnaire    Feeling of Stress : Not at all  Social Connections: Unknown (01/10/2022)   Received from [facility], [facility]   Social Network    Social Network: Not on file  Intimate Partner Violence: Unknown (12/01/2021)   Received from [facility], [facility]   HITS    Physically Hurt: Not on file    Insult or Talk Down To: Not on file    Threaten Physical Harm: Not on file    Scream or Curse: Not on file   Outpatient Medications Prior to Visit  Medication Sig Dispense Refill   etonogestrel (NEXPLANON) 68 MG IMPL implant 1 each by Subdermal route once.     ipratropium-albuterol  (DUONEB) 0.5-2.5 (3) MG/3ML SOLN Take 3 mLs by nebulization every 6 (six) hours as needed. 360 mL 1   ondansetron  (ZOFRAN  ODT) 4 MG disintegrating tablet Take 1 tablet (4 mg total) by mouth every 8 (eight) hours as needed for nausea or vomiting. 20 tablet 0   azithromycin  (ZITHROMAX  Z-PAK) 250 MG tablet 2 pills today then 1 pill a day for 4 days 6 each 0   levETIRAcetam  (KEPPRA ) 750 MG tablet Take 1 tablet (750 mg total) by mouth 2 (two) times daily. (Patient not taking: Reported on 02/06/2024) 60 tablet 1   pantoprazole  (PROTONIX ) 20 MG tablet Take 1 tablet (20 mg total) by mouth daily. 30 tablet 1   polyethylene glycol (MIRALAX  / GLYCOLAX ) packet Take 17 g by mouth daily. (Patient not taking: Reported on 05/11/2019) 30 each 1   predniSONE  (STERAPRED UNI-PAK 21 TAB) 10 MG (21) TBPK tablet Take 6 pills on day one then decrease by 1 pill each day (Patient not taking: Reported on 02/06/2024) 21 tablet 0   No facility-administered medications prior to visit.   Allergies  Allergen Reactions   Dust Mite  Extract Other (See Comments)    dogs   ROS: see HPI     Objective   Today's Vitals   02/06/24 0800  BP: 110/71  Pulse: 82  Temp: 98.3 F (36.8 C)  TempSrc: Oral  Weight: 135 lb (61.2 kg)  Height: 5' 7 (1.702 m)   GENERAL: Well-appearing, in NAD. Well nourished.  SKIN: Pink, warm and dry. No rash, lesion, ulceration, or ecchymoses.  Head: Normocephalic. NECK: Trachea midline. Full ROM w/o pain  or tenderness. No lymphadenopathy.  EARS: Tympanic membranes are intact, translucent without bulging and without drainage. Appropriate landmarks visualized.  EYES: Conjunctiva clear without exudates. EOMI, PERRL, no drainage present.  NOSE: Septum midline w/o deformity. Nares patent, mucosa pink and non-inflamed w/o drainage. No sinus tenderness.  THROAT: Uvula midline. Oropharynx clear. Tonsils non-inflamed without exudate. Mucous membranes pink and moist.  RESPIRATORY: Chest wall symmetrical. Respirations even and non-labored. Breath sounds clear to auscultation bilaterally.  CARDIAC: S1, S2 present, regular rate and rhythm without murmur or gallops. Peripheral pulses 2+ bilaterally.  MSK: Muscle tone and strength appropriate for age. Joints w/o tenderness, redness, or swelling.  EXTREMITIES: Without clubbing, cyanosis, or edema.  NEUROLOGIC: No motor or sensory deficits. Steady, even gait. C2-C12 intact.  PSYCH/MENTAL STATUS: Alert, oriented x 3. Cooperative, appropriate mood and affect.     Assessment & Plan:   1. Encounter to establish care (Primary) Patient is a 39- year-old female who presents today to establish care with primary care at [facility]. Reviewed the past medical history, family history, social history, surgical history, medications and allergies today- updates made as indicated. Patient has concerns today about her overall health.    2. Wellness examination Orders placed for fasting labs to be complete prior to physical exam.  - CBC with Differential/Platelet;  Future - Comprehensive metabolic panel with GFR; Future - Hemoglobin A1c; Future - Lipid panel; Future - TSH Rfx on Abnormal to Free T4; Future  3. GAD (generalized anxiety disorder) GAD7 completed with score of 1. Denies issues with panic attacks, shortness of breath, difficulty breathing, palpitations, hyperventilation, and dizziness. PHQ9 completed with score of 3. Denies active or passive suicidal ideations.  Discussed benefits of cognitive behavioral therapy (CBT) and first-line pharmacotherapy concurrently. Patient feels her mood is doing well and she does not want to start therapy or medication.   4. Mild intermittent asthma without complication Chronic. History of asthma, uncomplicated. No refills needed.   5. Seizure [facility]) Patient has a history of seizures and has seen neurology in the past. She reports that it was caused by stress and she is not on any medications. Her last seizure was in 2019. Declines neurology referral and will reach out if symptoms develop or worsen.   6. Nexplanon in place Nexplanon placed in 2020 and tolerating it well.    Return in about 4 weeks (around 03/05/2024) for Physical with fasting labs.   Wilhelmena Hanson, FNP

## 2024-02-06 NOTE — Patient Instructions (Addendum)
 Health Maintenance Recommendations Screening Testing Mammogram Every 1 -2 years based on history and risk factors Starting at age 31 Pap Smear Ages 21-39 every 3 years Ages 67-65 every 5 years with HPV testing More frequent testing may be required based on results and history Colon Cancer Screening Every 1-10 years based on test performed, risk factors, and history Starting at age 29 Bone Density Screening Every 2-10 years based on history Starting at age 58 for women Recommendations for men differ based on medication usage, history, and risk factors AAA Screening One time ultrasound Men 24-6 years old who have every smoked Lung Cancer Screening Low Dose Lung CT every 12 months Age 54-80 years with a 30 pack-year smoking history who still smoke or who have quit within the last 15 years   Screening Labs Routine  Labs: Complete Blood Count (CBC), Complete Metabolic Panel (CMP), Cholesterol (Lipid Panel) Every 6-12 months based on history and medications May be recommended more frequently based on current conditions or previous results Hemoglobin A1c Lab Every 3-12 months based on history and previous results Starting at age 21 or earlier with diagnosis of diabetes, high cholesterol, BMI >26, and/or risk factors Frequent monitoring for patients with diabetes to ensure blood sugar control Thyroid  Panel (TSH w/ T3 & T4) Every 6 months based on history, symptoms, and risk factors May be repeated more often if on medication HIV One time testing for all patients 38 and older May be repeated more frequently for patients with increased risk factors or exposure Hepatitis C One time testing for all patients 13 and older May be repeated more frequently for patients with increased risk factors or exposure Gonorrhea, Chlamydia Every 12 months for all sexually active persons 13-24 years Additional monitoring may be recommended for those who are considered high risk or who have  symptoms PSA Men 38-57 years old with risk factors Additional screening may be recommended from age 88-69 based on risk factors, symptoms, and history   Vaccine Recommendations Tetanus Booster All adults every 10 years Flu Vaccine All patients 6 months and older every year COVID Vaccine All patients 12 years and older Initial dosing with booster May recommend additional booster based on age and health history HPV Vaccine 2 doses all patients age 38-26 Dosing may be considered for patients over 26 Shingles Vaccine (Shingrix) 2 doses all adults 55 years and older Pneumonia (Pneumovax 23) All adults 65 years and older May recommend earlier dosing based on health history Pneumonia (Prevnar 64) All adults 65 years and older Dosed 1 year after Pneumovax 23   Additional Screening, Testing, and Vaccinations may be recommended on an individualized basis based on family history, health history, risk factors, and/or exposure.  __________________________________________________________   Diet Recommendations for All Patients   I recommend that all patients maintain a diet low in saturated fats, carbohydrates, and cholesterol. While this can be challenging at first, it is not impossible and small changes can make big differences.  Things to try: Decreasing the amount of soda, sweet tea, and/or juice to one or less per day and replace with water While water is always the first choice, if you do not like water you may consider adding a water additive without sugar to improve the taste other sugar free drinks Replace potatoes with a brightly colored vegetable at dinner Use healthy oils, such as canola oil or olive oil, instead of butter or hard margarine Limit your bread intake to two pieces or less a day Replace regular pasta with  low carb pasta options Bake, broil, or grill foods instead of frying Monitor portion sizes  Eat smaller, more frequent meals throughout the day instead of large  meals   An important thing to remember is, if you love foods that are not great for your health, you don't have to give them up completely. Instead, allow these foods to be a reward when you have done well. Allowing yourself to still have special treats every once in a while is a nice way to tell yourself thank you for working hard to keep yourself healthy.    Also remember that every day is a new day. If you have a bad day and "fall off the wagon", you can still climb right back up and keep moving along on your journey!   We have resources available to help you!  Some websites that may be helpful include: www.http://www.wall-moore.info/        Www.VeryWellFit.com _____________________________________________________________   Activity Recommendations for All Patients   I recommend that all adults get at least 20 minutes of moderate physical activity that elevates your heart rate at least 5 days out of the week.  Some examples include: Walking or jogging at a pace that allows you to carry on a conversation Cycling (stationary bike or outdoors) Water aerobics Yoga Weight lifting Dancing If physical limitations prevent you from putting stress on your joints, exercise in a pool or seated in a chair are excellent options.   Do determine your MAXIMUM heart rate for activity: YOUR AGE - 220 = MAX HeartRate    Remember! Do not push yourself too hard.  Start slowly and build up your pace, speed, weight, time in exercise, etc.  Allow your body to rest between exercise and get good sleep. You will need more water than normal when you are exerting yourself. Do not wait until you are thirsty to drink. Drink with a purpose of getting in at least 8, 8 ounce glasses of water a day plus more depending on how much you exercise and sweat.      If you begin to develop dizziness, chest pain, abdominal pain, jaw pain, shortness of breath, headache, vision changes, lightheadedness, or other concerning symptoms, stop the  activity and allow your body to rest. If your symptoms are severe, seek emergency evaluation immediately. If your symptoms are concerning, but not severe, please let us  know so that we can recommend further evaluation.     MyChart:  For all urgent or time sensitive needs we ask that you please call the office to avoid delays. Our number is [phone]) [id number]. MyChart is not constantly monitored and due to the large volume of messages a day, replies may take up to 72 business hours.   MyChart Policy: MyChart allows for you to see your visit notes, after visit summary, provider recommendations, lab and tests results, make an appointment, request refills, and contact your provider or the office for non-urgent questions or concerns. Providers are seeing patients during normal business hours and do not have built in time to review MyChart messages.  We ask that you allow a minimum of 3 business days for responses to [employer]. For this reason, please do not send urgent requests through MyChart. Please call the office at [phone]. New and ongoing conditions may require a visit. We have virtual and in person visit available for your convenience.  Complex MyChart concerns may require a visit. Your provider may request you schedule a virtual or in person visit to  ensure we are providing the best care possible. MyChart messages sent after 11:00 AM on Friday will not be received by the provider until Monday morning.    Lab and Test Results: You will receive your lab and test results on MyChart as soon as they are completed and results have been sent by the lab or testing facility. Due to this service, you will receive your results BEFORE your provider.  I review lab and tests results each morning prior to seeing patients. Some results require collaboration with other providers to ensure you are receiving the most appropriate care. For this reason, we ask that you please allow a minimum of 3-5 business  days from the time the ALL results have been received for your provider to receive and review lab and test results and contact you about these.  Most lab and test result comments from the provider will be sent through MyChart. Your provider may recommend changes to the plan of care, follow-up visits, repeat testing, ask questions, or request an office visit to discuss these results. You may reply directly to this message or call the office at [phone] to provide information for the provider or set up an appointment. In some instances, you will be called with test results and recommendations. Please let us  know if this is preferred and we will make note of this in your chart to provide this for you.    If you have not heard a response to your lab or test results in 5 business days from all results returning to MyChart, please call the office to let us  know. We ask that you please avoid calling prior to this time unless there is an emergent concern. Due to high call volumes, this can delay the resulting process.   After Hours: For all non-emergency after hours needs, please call the office at [phone] and select the option to reach the on-call provider service. On-call services are shared between multiple [facility] offices and therefore it will not be possible to speak directly with your provider. On-call providers may provide medical advice and recommendations, but are unable to provide refills for maintenance medications.  For all emergency or urgent medical needs after normal business hours, we recommend that you seek care at the closest Urgent Care or Emergency Department to ensure appropriate treatment in a timely manner.  [facility] has a 24 hour emergency room located on the ground floor for your convenience.    Urgent Concerns During the Business Day Providers are seeing patients from 8AM to 5PM, Monday through Thursday, and 8AM to 12PM on Friday with a busy  schedule and are most often not able to respond to non-urgent calls until the end of the day or the next business day. If you should have URGENT concerns during the day, please call and speak to the nurse or schedule a same day appointment so that we can address your concern without delay.    Thank you, again, for choosing me as your health care partner. I appreciate your trust and look forward to learning more about you.    Wilhelmena Hanson, FNP-C

## 2024-02-13 ENCOUNTER — Telehealth: Payer: Self-pay | Admitting: *Deleted

## 2024-02-13 DIAGNOSIS — F411 Generalized anxiety disorder: Secondary | ICD-10-CM

## 2024-02-13 NOTE — Progress Notes (Unsigned)
 Complex Care Management Note Care Guide Note  02/13/2024 Name: Denise Lopez MRN: [id number] DOB: 1990-10-12   Complex Care Management Outreach Attempts: An unsuccessful telephone outreach was attempted today to offer the patient information about available complex care management services.  Follow Up Plan:  Additional outreach attempts will be made to offer the patient complex care management information and services.   Encounter Outcome:  No Answer  Kandis Ormond, CMA [place]  [facility], [facility] Guide Direct Dial: [phone]  Fax: [phone] Website: [email or web address]

## 2024-02-15 NOTE — Progress Notes (Unsigned)
 Complex Care Management Note Care Guide Note  02/15/2024 Name: Denise Lopez MRN: [id number] DOB: Apr 02, 1991   Complex Care Management Outreach Attempts: A second unsuccessful outreach was attempted today to offer the patient with information about available complex care management services.  Follow Up Plan:  Additional outreach attempts will be made to offer the patient complex care management information and services.   Encounter Outcome:  No Answer  Kandis Ormond, CMA [address]  [facility], [facility] Guide Direct Dial: [phone]  Fax: [phone] Website: [email or web address]

## 2024-02-18 NOTE — Progress Notes (Signed)
 Complex Care Management Note Care Guide Note  02/18/2024 Name: Denise Lopez MRN: [id number] DOB: 12-06-1990   Complex Care Management Outreach Attempts: A third unsuccessful outreach was attempted today to offer the patient with information about available complex care management services.  Follow Up Plan:  No further outreach attempts will be made at this time. We have been unable to contact the patient to offer or enroll patient in complex care management services.  Encounter Outcome:  No Answer  Thedford Franks, CMA [place]  [facility], [facility] Guide Direct Dial: [phone]  Fax: [phone] Website: [email or web address]

## 2024-02-19 NOTE — Progress Notes (Signed)
 Complex Care Management Note  Care Guide Note 02/19/2024 Name: ANNEBELLE BOSTIC MRN: [id number] DOB: 1991/02/16  Briant DELENA Finder is a 33 y.o. year old female who sees Towana Small, FNP for primary care. I reached out to Briant DELENA Finder by phone today to offer complex care management services.  Ms. Schul was given information about Complex Care Management services today including:   The Complex Care Management services include support from the care team which includes your Nurse Care Manager, Clinical Social Worker, or Pharmacist.  The Complex Care Management team is here to help remove barriers to the health concerns and goals most important to you. Complex Care Management services are voluntary, and the patient may decline or stop services at any time by request to their care team member.   Complex Care Management Consent Status: Patient agreed to services and verbal consent obtained.   Follow up plan:  Telephone appointment with complex care management team member scheduled for:  03/17/2024  Encounter Outcome:  Patient Scheduled  Thedford Franks, CMA [place]  [facility], [facility] Guide Direct Dial: [phone]  Fax: [phone] Website: [email or web address]

## 2024-03-05 ENCOUNTER — Ambulatory Visit (HOSPITAL_BASED_OUTPATIENT_CLINIC_OR_DEPARTMENT_OTHER)

## 2024-03-05 ENCOUNTER — Other Ambulatory Visit: Payer: Self-pay | Admitting: Family Medicine

## 2024-03-05 DIAGNOSIS — Z0000 Encounter for general adult medical examination without abnormal findings: Secondary | ICD-10-CM | POA: Diagnosis not present

## 2024-03-06 ENCOUNTER — Ambulatory Visit: Payer: Self-pay | Admitting: Family Medicine

## 2024-03-06 LAB — HEMOGLOBIN A1C
Est. average glucose Bld gHb Est-mCnc: 120 mg/dL
Hgb A1c MFr Bld: 5.8 % — ABNORMAL HIGH (ref 4.8–5.6)

## 2024-03-06 LAB — COMPREHENSIVE METABOLIC PANEL WITH GFR
ALT: 18 IU/L (ref 0–32)
AST: 22 IU/L (ref 0–40)
Albumin: 4.2 g/dL (ref 4.0–5.0)
Alkaline Phosphatase: 59 IU/L (ref 44–121)
BUN/Creatinine Ratio: 11 (ref 9–23)
BUN: 8 mg/dL (ref 6–20)
Bilirubin Total: 0.6 mg/dL (ref 0.0–1.2)
CO2: 17 mmol/L — ABNORMAL LOW (ref 20–29)
Calcium: 9 mg/dL (ref 8.7–10.2)
Chloride: 105 mmol/L (ref 96–106)
Creatinine, Ser: 0.73 mg/dL (ref 0.57–1.00)
Globulin, Total: 2.4 g/dL (ref 1.5–4.5)
Glucose: 104 mg/dL — ABNORMAL HIGH (ref 70–99)
Potassium: 4.2 mmol/L (ref 3.5–5.2)
Sodium: 138 mmol/L (ref 134–144)
Total Protein: 6.6 g/dL (ref 6.0–8.5)
eGFR: 113 mL/min/1.73 (ref >59)

## 2024-03-06 LAB — CBC WITH DIFFERENTIAL/PLATELET
Basophils Absolute: 0.1 x10E3/uL (ref 0.0–0.2)
Basos: 1 %
EOS (ABSOLUTE): 0.2 x10E3/uL (ref 0.0–0.4)
Eos: 3 %
Hematocrit: 38.9 % (ref 34.0–46.6)
Hemoglobin: 12.2 g/dL (ref 11.1–15.9)
Immature Grans (Abs): 0 x10E3/uL (ref 0.0–0.1)
Immature Granulocytes: 0 %
Lymphocytes Absolute: 1.9 x10E3/uL (ref 0.7–3.1)
Lymphs: 27 %
MCH: 27.8 pg (ref 26.6–33.0)
MCHC: 31.4 g/dL — ABNORMAL LOW (ref 31.5–35.7)
MCV: 89 fL (ref 79–97)
Monocytes Absolute: 0.5 x10E3/uL (ref 0.1–0.9)
Monocytes: 7 %
Neutrophils Absolute: 4.2 x10E3/uL (ref 1.4–7.0)
Neutrophils: 62 %
Platelets: 263 x10E3/uL (ref 150–450)
RBC: 4.39 x10E6/uL (ref 3.77–5.28)
RDW: 13.4 % (ref 11.7–15.4)
WBC: 6.8 x10E3/uL (ref 3.4–10.8)

## 2024-03-06 LAB — TSH RFX ON ABNORMAL TO FREE T4: TSH: 0.69 u[IU]/mL (ref 0.450–4.500)

## 2024-03-06 LAB — LIPID PANEL
Chol/HDL Ratio: 3 ratio (ref 0.0–4.4)
Cholesterol, Total: 155 mg/dL (ref 100–199)
HDL: 52 mg/dL (ref >39)
LDL Chol Calc (NIH): 92 mg/dL (ref 0–99)
Triglycerides: 50 mg/dL (ref 0–149)
VLDL Cholesterol Cal: 11 mg/dL (ref 5–40)

## 2024-03-12 ENCOUNTER — Other Ambulatory Visit (HOSPITAL_COMMUNITY)
Admission: RE | Admit: 2024-03-12 | Discharge: 2024-03-12 | Disposition: A | Discharge status: Home / Self Care | Source: Ambulatory Visit | Attending: Family Medicine | Admitting: Family Medicine

## 2024-03-12 ENCOUNTER — Ambulatory Visit (INDEPENDENT_AMBULATORY_CARE_PROVIDER_SITE_OTHER): Admitting: Family Medicine

## 2024-03-12 ENCOUNTER — Encounter: Payer: Self-pay | Admitting: Family Medicine

## 2024-03-12 VITALS — BP 118/63 | HR 75 | Temp 98.6°F | Resp 20 | Ht 67.0 in | Wt 130.0 lb

## 2024-03-12 DIAGNOSIS — Z124 Encounter for screening for malignant neoplasm of cervix: Secondary | ICD-10-CM | POA: Diagnosis not present

## 2024-03-12 DIAGNOSIS — Z0000 Encounter for general adult medical examination without abnormal findings: Secondary | ICD-10-CM | POA: Diagnosis not present

## 2024-03-12 DIAGNOSIS — Z23 Encounter for immunization: Secondary | ICD-10-CM

## 2024-03-12 DIAGNOSIS — J4520 Mild intermittent asthma, uncomplicated: Secondary | ICD-10-CM | POA: Diagnosis not present

## 2024-03-12 NOTE — Progress Notes (Signed)
 Subjective:   LERAE Lopez 11-Oct-1990  03/12/2024   CC: Chief Complaint  Patient presents with   Establish Care   HPI: Denise Lopez is a 33 y.o. female who presents for a routine health maintenance exam.  Labs collected prior to visit and discussed today.   HEALTH SCREENINGS: - Vision Screening: plans to schedule  - Dental Visits: plans to schedule  - Pap smear: pap done - Breast Exam: done today  - STD Screening: Ordered today - Mammogram (40+): Not applicable  - Colonoscopy (45+): Not applicable  - Bone Density (65+ or under 65 with predisposing conditions): Not applicable  - Lung CA screening with low-dose CT:  Not applicable Adults age 59-80 who are current cigarette smokers or quit within the last 15 years. Must have 20 pack year history.   Depression and Anxiety Screen done today and results listed below:     03/12/2024    8:28 AM 02/06/2024    8:11 AM  Depression screen PHQ 2/9  Decreased Interest 0 1  Down, Depressed, Hopeless 0 0  PHQ - 2 Score 0 1  Altered sleeping 1 0  Tired, decreased energy 1 1  Change in appetite 1 1  Feeling bad or failure about yourself  0 0  Trouble concentrating 0 0  Moving slowly or fidgety/restless 0 0  Suicidal thoughts 0 0  PHQ-9 Score 3 3  Difficult doing work/chores Not difficult at all Not difficult at all      03/12/2024    8:31 AM 02/06/2024    8:11 AM  GAD 7 : Generalized Anxiety Score  Nervous, Anxious, on Edge 1 0  Control/stop worrying 1 0  Worry too much - different things 1 0  Trouble relaxing 0 0  Restless 0 0  Easily annoyed or irritable 1 1  Afraid - awful might happen 0 0  Total GAD 7 Score 4 1  Anxiety Difficulty Not difficult at all Not difficult at all    IMMUNIZATIONS: - Tdap: Tetanus vaccination status reviewed: last tetanus booster within 10 years. - HPV: Up to date - Influenza: N/A - Prevnar 20: will consider - Zostavax (50+): Not applicable  Past medical history, surgical  history, medications, allergies, family history and social history reviewed with patient today and changes made to appropriate areas of the chart.   Past Medical History:  Diagnosis Date   Allergy    Anxiety    Asthma    Seizures (HCC)    Syncope and collapse     Past Surgical History:  Procedure Laterality Date   TONSILLECTOMY      Current Outpatient Medications on File Prior to Visit  Medication Sig   etonogestrel (NEXPLANON) 68 MG IMPL implant 1 each by Subdermal route once.   ipratropium-albuterol  (DUONEB) 0.5-2.5 (3) MG/3ML SOLN Take 3 mLs by nebulization every 6 (six) hours as needed.   ondansetron  (ZOFRAN  ODT) 4 MG disintegrating tablet Take 1 tablet (4 mg total) by mouth every 8 (eight) hours as needed for nausea or vomiting.   No current facility-administered medications on file prior to visit.    Allergies  Allergen Reactions   Dust Mite Extract Other (See Comments)    dogs     Social History   Socioeconomic History   Marital status: Single    Spouse name: Not on file   Number of children: Not on file   Years of education: 12   Highest education level: Some college, no degree  Occupational History  Occupation: works for [employer]  Tobacco Use   Smoking status: Never   Smokeless tobacco: Never  Vaping Use   Vaping status: Never Used  Substance and Sexual Activity   Alcohol use: Yes    Alcohol/week: 2.0 standard drinks of alcohol    Types: 2 Shots of liquor per week    Comment: Dont go out often so dont drink often   Drug use: Yes    Types: Marijuana   Sexual activity: Yes    Birth control/protection: Implant  Other Topics Concern   Not on file  Social History Narrative   None   Social Drivers of Health   Financial Resource Strain: Low Risk  (03/10/2024)   Overall Financial Resource Strain (CARDIA)    Difficulty of Paying Living Expenses: Not very hard  Food Insecurity: Food Insecurity Present (03/10/2024)   Hunger Vital Sign    Worried About  Running Out of Food in the Last Year: Sometimes true    Ran Out of Food in the Last Year: Sometimes true  Transportation Needs: No Transportation Needs (03/10/2024)   PRAPARE - Administrator, Civil Service (Medical): No    Lack of Transportation (Non-Medical): No  Physical Activity: Inactive (03/10/2024)   Exercise Vital Sign    Days of Exercise per Week: 0 days    Minutes of Exercise per Session: Not on file  Stress: No Stress Concern Present (03/10/2024)   [employer] of Occupational Health - Occupational Stress Questionnaire    Feeling of Stress: Only a little  Social Connections: Moderately Isolated (03/10/2024)   Social Connection and Isolation Panel    Frequency of Communication with Friends and Family: More than three times a week    Frequency of Social Gatherings with Friends and Family: Once a week    Attends Religious Services: 1 to 4 times per year    Active Member of [employer] or Organizations: No    Attends Engineer, structural: Not on file    Marital Status: Never married  Intimate Partner Violence: Unknown (12/01/2021)   Received from [facility]   HITS    Physically Hurt: Not on file    Insult or Talk Down To: Not on file    Threaten Physical Harm: Not on file    Scream or Curse: Not on file   Social History   Tobacco Use  Smoking Status Never  Smokeless Tobacco Never   Social History   Substance and Sexual Activity  Alcohol Use Yes   Alcohol/week: 2.0 standard drinks of alcohol   Types: 2 Shots of liquor per week   Comment: Dont go out often so dont drink often    Family History  Problem Relation Age of Onset   Anxiety disorder Mother    Diabetes Mother    Hypertension Mother    Asthma Mother    Seizures Cousin    Diabetes Maternal Grandfather    Hypertension Maternal Grandfather    Sudden Cardiac Death Neg Hx      ROS: Denies fever, fatigue, unexplained weight loss/gain, chest pain, SHOB, and palpitations. Denies  neurological deficits, gastrointestinal or genitourinary complaints, and skin changes.   Objective:   Today's Vitals   03/12/24 0818  BP: 118/63  Pulse: 75  Resp: 20  Temp: 98.6 F (37 C)  TempSrc: Oral  SpO2: 98%  Weight: 130 lb (59 kg)  Height: 5' 7 (1.702 m)  PainSc: 0-No pain    GENERAL APPEARANCE: Well-appearing, in NAD. Well nourished.  SKIN: Pink, warm and dry. Turgor normal. No rash, lesion, ulceration, or ecchymoses. Hair evenly distributed.  HEENT: HEAD: Normocephalic.  EYES: PERRLA. EOMI. Lids intact w/o defect. Sclera white, Conjunctiva pink w/o exudate.  EARS: External ear w/o redness, swelling, masses or lesions. EAC clear. TM's intact, translucent w/o bulging, appropriate landmarks visualized. Appropriate acuity to conversational tones.  NOSE: Septum midline w/o deformity. Nares patent, mucosa pink and non-inflamed w/o drainage. No sinus tenderness.  THROAT: Uvula midline. Oropharynx clear. Tonsils non-inflamed w/o exudate. Oral mucosa pink and moist.  NECK: Supple, Trachea midline. Full ROM w/o pain or tenderness. No lymphadenopathy. Thyroid non-tender w/o enlargement or palpable masses.  BREASTS: Breasts pendulous, symmetrical, and w/o palpable masses. Nipples everted and w/o discharge. No rash or skin retraction. No axillary or supraclavicular lymphadenopathy.  RESPIRATORY: Chest wall symmetrical w/o masses. Respirations even and non-labored. Breath sounds clear to auscultation bilaterally. No wheezes, rales, rhonchi, or crackles. CARDIAC: S1, S2 present, regular rate and rhythm. No gallops, murmurs, rubs, or clicks. PMI w/o lifts, heaves, or thrills. No carotid bruits. Capillary refill <2 seconds. Peripheral pulses 2+ bilaterally. GI: Abdomen soft w/o distention. Normoactive bowel sounds. No palpable masses or tenderness. No guarding or rebound tenderness. Liver and spleen w/o tenderness or enlargement. No CVA tenderness.  GU: External genitalia without erythema,  lesions, or masses. No lymphadenopathy. Vaginal mucosa pink and moist without exudate, lesions, or ulcerations. Cervix pink without discharge. Cervical os closed. Uterus and adnexae palpable, not enlarged, and w/o tenderness. No palpable masses.  MSK: Muscle tone and strength appropriate for age, w/o atrophy or abnormal movement.  EXTREMITIES: Active ROM intact, w/o tenderness, crepitus, or contracture. No obvious joint deformities or effusions. No clubbing, edema, or cyanosis.  NEUROLOGIC: CN's II-XII intact. Motor strength symmetrical with no obvious weakness. No sensory deficits. DTR's 2+ symmetric bilaterally. Steady, even gait.  PSYCH/MENTAL STATUS: Alert, oriented x 3. Cooperative, appropriate mood and affect.   Chaperoned by Rexene Chestnut, CMA   Results for orders placed or performed in visit on 03/05/24  TSH Rfx on Abnormal to Free T4   Collection Time: 03/05/24  8:56 AM  Result Value Ref Range   TSH 0.690 0.450 - 4.500 uIU/mL  Lipid panel   Collection Time: 03/05/24  8:56 AM  Result Value Ref Range   Cholesterol, Total 155 100 - 199 mg/dL   Triglycerides 50 0 - 149 mg/dL   HDL 52 >60 mg/dL   VLDL Cholesterol Cal 11 5 - 40 mg/dL   LDL Chol Calc (NIH) 92 0 - 99 mg/dL   Chol/HDL Ratio 3.0 0.0 - 4.4 ratio  Hemoglobin A1c   Collection Time: 03/05/24  8:56 AM  Result Value Ref Range   Hgb A1c MFr Bld 5.8 (H) 4.8 - 5.6 %   Est. average glucose Bld gHb Est-mCnc 120 mg/dL  Comprehensive metabolic panel with GFR   Collection Time: 03/05/24  8:56 AM  Result Value Ref Range   Glucose 104 (H) 70 - 99 mg/dL   BUN 8 6 - 20 mg/dL   Creatinine, Ser 9.26 0.57 - 1.00 mg/dL   eGFR 886 >40 fO/fpw/8.26   BUN/Creatinine Ratio 11 9 - 23   Sodium 138 134 - 144 mmol/L   Potassium 4.2 3.5 - 5.2 mmol/L   Chloride 105 96 - 106 mmol/L   CO2 17 (L) 20 - 29 mmol/L   Calcium 9.0 8.7 - 10.2 mg/dL   Total Protein 6.6 6.0 - 8.5 g/dL   Albumin 4.2 4.0 - 5.0 g/dL  Globulin, Total 2.4 1.5 - 4.5 g/dL    Bilirubin Total 0.6 0.0 - 1.2 mg/dL   Alkaline Phosphatase 59 44 - 121 IU/L   AST 22 0 - 40 IU/L   ALT 18 0 - 32 IU/L  CBC with Differential/Platelet   Collection Time: 03/05/24  8:56 AM  Result Value Ref Range   WBC 6.8 3.4 - 10.8 x10E3/uL   RBC 4.39 3.77 - 5.28 x10E6/uL   Hemoglobin 12.2 11.1 - 15.9 g/dL   Hematocrit 61.0 65.9 - 46.6 %   MCV 89 79 - 97 fL   MCH 27.8 26.6 - 33.0 pg   MCHC 31.4 (L) 31.5 - 35.7 g/dL   RDW 86.5 88.2 - 84.5 %   Platelets 263 150 - 450 x10E3/uL   Neutrophils 62 Not Estab. %   Lymphs 27 Not Estab. %   Monocytes 7 Not Estab. %   Eos 3 Not Estab. %   Basos 1 Not Estab. %   Neutrophils Absolute 4.2 1.4 - 7.0 x10E3/uL   Lymphocytes Absolute 1.9 0.7 - 3.1 x10E3/uL   Monocytes Absolute 0.5 0.1 - 0.9 x10E3/uL   EOS (ABSOLUTE) 0.2 0.0 - 0.4 x10E3/uL   Basophils Absolute 0.1 0.0 - 0.2 x10E3/uL   Immature Granulocytes 0 Not Estab. %   Immature Grans (Abs) 0.0 0.0 - 0.1 x10E3/uL    Assessment & Plan:   1. Wellness examination (Primary) - Encouraged a healthy well-balanced diet. Patient may adjust caloric intake to maintain or achieve ideal body weight. May reduce intake of dietary saturated fat and total fat and have adequate dietary potassium and calcium preferably from fresh fruits, vegetables, and low-fat dairy products.   - Advised to avoid cigarette smoking. - Discussed with the patient that most people either abstain from alcohol or drink within safe limits (<=14/week and <=4 drinks/occasion for males, <=7/weeks and <= 3 drinks/occasion for females) and that the risk for alcohol disorders and other health effects rises proportionally with the number of drinks per week and how often a drinker exceeds daily limits. - Discussed cessation/primary prevention of drug use and availability of treatment for abuse.  - Discussed sexually transmitted diseases, avoidance of unintended pregnancy and contraceptive alternatives. - Stressed the importance of regular  exercise - Injury prevention: Discussed safety belts, safety helmets, smoke detector, smoking near bedding or upholstery.  - Dental health: Discussed importance of regular tooth brushing, flossing, and dental visits.  NEXT PREVENTATIVE PHYSICAL DUE IN 1 YEAR.  2. Mild intermittent asthma without complication See #3  3. Need for prophylactic vaccination against Streptococcus pneumoniae (pneumococcus) Discussed about benefits due to history of asthma. Will consider at this time.   4. Screening for cervical cancer Clinical breast exam completed with no abnormalities noted. No skin changes, nipple discharge, masses/lumps, or tenderness present. No enlarged axillary lymph nodes palpated. Advised patient to perform breast self-examination monthly during menstrual cycle. Visual inspection of external genitalia of vagina. No inguinal lymphadenopathy palpated. Vaginal walls and cervix appear pink during speculum examination. Cervical os visualized Pap completed with no complications. Will update patient with results.  - Cytology - PAP  Return in about 6 months (around 09/12/2024) for thyroid- LABS ONLY .  Patient to reach out to office if new, worrisome, or unresolved symptoms arise or if no improvement in patient's condition. Patient verbalized understanding and is agreeable to treatment plan. All questions answered to patient's satisfaction.   Evalene Arts, FNP

## 2024-03-12 NOTE — Patient Instructions (Signed)
 Health Maintenance Recommendations Screening Testing Mammogram Every 1 -2 years based on history and risk factors Starting at age 31 Pap Smear Ages 21-39 every 3 years Ages 23-65 every 5 years with HPV testing More frequent testing may be required based on results and history Colon Cancer Screening Every 1-10 years based on test performed, risk factors, and history Starting at age 102 Bone Density Screening Every 2-10 years based on history Starting at age 69 for women Recommendations for men differ based on medication usage, history, and risk factors AAA Screening One time ultrasound Men 30-10 years old who have every smoked Lung Cancer Screening Low Dose Lung CT every 12 months Age 20-80 years with a 30 pack-year smoking history who still smoke or who have quit within the last 15 years   Screening Labs Routine  Labs: Complete Blood Count (CBC), Complete Metabolic Panel (CMP), Cholesterol (Lipid Panel) Every 6-12 months based on history and medications May be recommended more frequently based on current conditions or previous results Hemoglobin A1c Lab Every 3-12 months based on history and previous results Starting at age 24 or earlier with diagnosis of diabetes, high cholesterol, BMI >26, and/or risk factors Frequent monitoring for patients with diabetes to ensure blood sugar control Thyroid Panel (TSH w/ T3 & T4) Every 6 months based on history, symptoms, and risk factors May be repeated more often if on medication HIV One time testing for all patients 23 and older May be repeated more frequently for patients with increased risk factors or exposure Hepatitis C One time testing for all patients 47 and older May be repeated more frequently for patients with increased risk factors or exposure Gonorrhea, Chlamydia Every 12 months for all sexually active persons 13-24 years Additional monitoring may be recommended for those who are considered high risk or who have  symptoms PSA Men 72-66 years old with risk factors Additional screening may be recommended from age 2-69 based on risk factors, symptoms, and history   Vaccine Recommendations Tetanus Booster All adults every 10 years Flu Vaccine All patients 6 months and older every year COVID Vaccine All patients 12 years and older Initial dosing with booster May recommend additional booster based on age and health history HPV Vaccine 2 doses all patients age 56-26 Dosing may be considered for patients over 26 Shingles Vaccine (Shingrix) 2 doses all adults 55 years and older Pneumonia (Pneumovax 23) All adults 65 years and older May recommend earlier dosing based on health history Pneumonia (Prevnar 16) All adults 65 years and older Dosed 1 year after Pneumovax 23   Additional Screening, Testing, and Vaccinations may be recommended on an individualized basis based on family history, health history, risk factors, and/or exposure.  __________________________________________________________   Diet Recommendations for All Patients   I recommend that all patients maintain a diet low in saturated fats, carbohydrates, and cholesterol. While this can be challenging at first, it is not impossible and small changes can make big differences.  Things to try: Decreasing the amount of soda, sweet tea, and/or juice to one or less per day and replace with water While water is always the first choice, if you do not like water you may consider adding a water additive without sugar to improve the taste other sugar free drinks Replace potatoes with a brightly colored vegetable at dinner Use healthy oils, such as canola oil or olive oil, instead of butter or hard margarine Limit your bread intake to two pieces or less a day Replace regular pasta with  low carb pasta options Bake, broil, or grill foods instead of frying Monitor portion sizes  Eat smaller, more frequent meals throughout the day instead of large  meals   An important thing to remember is, if you love foods that are not great for your health, you don't have to give them up completely. Instead, allow these foods to be a reward when you have done well. Allowing yourself to still have special treats every once in a while is a nice way to tell yourself thank you for working hard to keep yourself healthy.    Also remember that every day is a new day. If you have a bad day and "fall off the wagon", you can still climb right back up and keep moving along on your journey!   We have resources available to help you!  Some websites that may be helpful include: www.http://www.wall-moore.info/        Www.VeryWellFit.com _____________________________________________________________   Activity Recommendations for All Patients   I recommend that all adults get at least 20 minutes of moderate physical activity that elevates your heart rate at least 5 days out of the week.  Some examples include: Walking or jogging at a pace that allows you to carry on a conversation Cycling (stationary bike or outdoors) Water aerobics Yoga Weight lifting Dancing If physical limitations prevent you from putting stress on your joints, exercise in a pool or seated in a chair are excellent options.   Do determine your MAXIMUM heart rate for activity: YOUR AGE - 220 = MAX HeartRate    Remember! Do not push yourself too hard.  Start slowly and build up your pace, speed, weight, time in exercise, etc.  Allow your body to rest between exercise and get good sleep. You will need more water than normal when you are exerting yourself. Do not wait until you are thirsty to drink. Drink with a purpose of getting in at least 8, 8 ounce glasses of water a day plus more depending on how much you exercise and sweat.      If you begin to develop dizziness, chest pain, abdominal pain, jaw pain, shortness of breath, headache, vision changes, lightheadedness, or other concerning symptoms, stop the  activity and allow your body to rest. If your symptoms are severe, seek emergency evaluation immediately. If your symptoms are concerning, but not severe, please let us know so that we can recommend further evaluation.

## 2024-03-14 LAB — CYTOLOGY - PAP
Chlamydia: NEGATIVE
Comment: NEGATIVE
Comment: NEGATIVE
Comment: NEGATIVE
Comment: NORMAL
Diagnosis: NEGATIVE
High risk HPV: NEGATIVE
Neisseria Gonorrhea: NEGATIVE
Trichomonas: NEGATIVE

## 2024-03-17 ENCOUNTER — Telehealth: Payer: Self-pay

## 2024-03-17 NOTE — Patient Instructions (Signed)
 Briant DELENA Finder - I am sorry I was unable to reach you today for our scheduled appointment. I work with Towana Small, FNP and am calling to support your healthcare needs. Please contact me at [phone] at your earliest convenience. I look forward to speaking with you soon.   Thank you,   Jackson Acron [facility] Health RN Care Manager Direct Dial: [phone]  Fax: [phone] Website: [email or web address]

## 2024-03-31 ENCOUNTER — Ambulatory Visit: Payer: Self-pay | Admitting: Family Medicine

## 2024-04-04 ENCOUNTER — Other Ambulatory Visit: Payer: Self-pay

## 2024-04-04 DIAGNOSIS — F411 Generalized anxiety disorder: Secondary | ICD-10-CM

## 2024-04-04 NOTE — Patient Instructions (Signed)
 Visit Information  Denise Lopez was given information about Medicaid Managed Care team care coordination services as a part of their [place] benefit.   If you would like to schedule transportation through your [place] plan, please call the following number at least 2 days in advance of your appointment: [phone].   There is no limit to the number of trips during the year between medical appointments, healthcare facilities, or pharmacies. Transportation must be scheduled at least 2 business days before but not more than thirty 30 days before of your appointment.  Call the Behavioral Health Crisis Line at [phone], at any time, 24 hours a day, 7 days a week. If you are in danger or need immediate medical attention call 911.   Denise Lopez - following are the goals we discussed in your visit today:   Goals Addressed             This Visit's Progress    VBCI RN Care Plan       Problems:  Chronic Disease Management support and education needs related to Anxiety  Goal: Over the next 30 days the Patient will work with Child psychotherapist to address Family and relationship dysfunction related to the management of Anxiety as evidenced by review of electronic medical record and patient or social worker report      Interventions:   Evaluation of current treatment plan related to Anxiety,  self-management and patient's adherence to plan as established by provider. Discussed plans with patient for ongoing care management follow up and provided patient with direct contact information for care management team Social Work referral for Counseling services  Patient Self-Care Activities:  Work with the social worker to address care coordination needs and will continue to work with the clinical team to address health care and disease management related needs  Plan:  Telephone follow up appointment with care management team member scheduled for:  one month.           VBCI RN Care Plan       Problems:  Knowledge Deficits related to Health Maintenance.  Goal: Over the next 30 days the Patient will demonstrate Improved health management independence as evidenced by making appointments for dental and vision         Interventions:   Health Maintenance Interventions: Patient interviewed about adult health maintenance status including  Regular eye checkups Regular Dental Care     Patient Self-Care Activities:  -Call health insurance to inquire of in-network providers for dental and vision.  Plan:  Telephone follow up appointment with care management team member scheduled for:  one month.               Patient verbalizes understanding of instructions and care plan provided today and agrees to view in MyChart. Active MyChart status and patient understanding of how to access instructions and care plan via MyChart confirmed with patient.     Telephone follow up appointment with Managed Medicaid care management team member scheduled qnm:Qmpijb, September 5th at 2:00pm.   Santana Stamp BSN, CCM [place]  [facility] Population Health RN Care Manager Direct Dial: [phone]  Fax: [phone]   Following is a copy of your plan of care:  There are no care plans that you recently modified to display for this patient.

## 2024-04-04 NOTE — Patient Outreach (Signed)
 Complex Care Management   Visit Note  04/04/2024  Name:  Denise Lopez MRN: [id number] DOB: 22-Dec-1990  Situation: Referral received for Complex Care Management related to Generalized Anxiety Disorder. I obtained verbal consent from Patient.  Visit completed with Denise Lopez  on the phone.  Main concern is managing anxiety due to difficult family relationships/dynamics.  Recently diagnosed with GAD but not prescribed anxiety meds.  She is interested in speaking to LCSW for support, advice, or referral to counseling.      Background:   Past Medical History:  Diagnosis Date   Allergy    Anxiety    Asthma    Seizures (HCC)    Syncope and collapse     Assessment: Patient Reported Symptoms:  Cognitive Cognitive Status: Alert and oriented to person, place, and time, Insightful and able to interpret abstract concepts, Normal speech and language skills      Neurological Neurological Review of Symptoms: No symptoms reported    HEENT HEENT Symptoms Reported: No symptoms reported      Cardiovascular Cardiovascular Symptoms Reported: No symptoms reported    Respiratory Respiratory Symptoms Reported: No symptoms reported Additional Respiratory Details: She has Duoneb she takes as needed for difficulty breathing during weather/season changes. Respiratory Management Strategies: Asthma action plan, Medication therapy  Endocrine Endocrine Symptoms Reported: No symptoms reported Is patient diabetic?: No    Gastrointestinal Gastrointestinal Symptoms Reported: No symptoms reported      Genitourinary Genitourinary Symptoms Reported: No symptoms reported    Integumentary Integumentary Symptoms Reported: No symptoms reported    Musculoskeletal Musculoskelatal Symptoms Reviewed: No symptoms reported   Falls in the past year?: No    Psychosocial       Quality of Family Relationships: stressful Do you feel physically threatened by others?: No      04/04/2024    2:35 PM  Depression  screen PHQ 2/9  Decreased Interest 0  Down, Depressed, Hopeless 0  PHQ - 2 Score 0    There were no vitals filed for this visit.  Medications Reviewed Today     Reviewed by Lucian Santana DELENA, RN (Registered Nurse) on 04/04/24 at 1427  Med List Status: <None>   Medication Order Taking? Sig Documenting Provider Last Dose Status Informant  etonogestrel (NEXPLANON) 68 MG IMPL implant [id number] Yes 1 each by Subdermal route once. Elridge Arnulfo Silvan, NP  Active   ipratropium-albuterol  (DUONEB) 0.5-2.5 (3) MG/3ML SOLN [id number] Yes Take 3 mLs by nebulization every 6 (six) hours as needed. Gasper Devere ORN, PA-C  Active             Recommendation:   Referral to: :VBCI LCSW for Generalized Anxiety Disorder Patient will call health insurance customer service to inquire of in-network provider for eye care.   Follow Up Plan:   Telephone follow-up in 1 month  Santana Lucian BSN, CCM [place]  [facility]BCI Population Health RN Care Manager Direct Dial: [phone]  Fax: [phone]

## 2024-04-17 ENCOUNTER — Telehealth: Payer: Self-pay

## 2024-04-17 NOTE — Progress Notes (Signed)
 Complex Care Management Note  Care Guide Note 04/17/2024 Name: MARCIA HARTWELL MRN: [id number] DOB: 1990-09-03  Briant DELENA Finder is a 33 y.o. year old female who sees Towana Small, FNP for primary care. I reached out to Briant DELENA Finder by phone today to offer complex care management services.  Ms. Nary was given information about Complex Care Management services today including:   The Complex Care Management services include support from the care team which includes your Nurse Care Manager, Clinical Social Worker, or Pharmacist.  The Complex Care Management team is here to help remove barriers to the health concerns and goals most important to you. Complex Care Management services are voluntary, and the patient may decline or stop services at any time by request to their care team member.   Complex Care Management Consent Status: Patient agreed to services and verbal consent obtained.   Follow up plan:  Telephone appointment with complex care management team member scheduled for:  05/05/2024  Encounter Outcome:  Patient Scheduled  Jeoffrey Buffalo , RMA     [place]  [place], [facility] Guide  Direct Dial: [phone]  Website: [email or web address]

## 2024-05-02 ENCOUNTER — Other Ambulatory Visit: Payer: Self-pay

## 2024-05-02 NOTE — Patient Outreach (Signed)
 Conference call completed with patient and agent at [employer].  Patient is requesting insurance card.

## 2024-05-02 NOTE — Patient Outreach (Signed)
 Complex Care Management   Visit Note  05/02/2024  Name:  Denise Lopez MRN: [id number] DOB: 30-Mar-1991  Situation: Referral received for Complex Care Management related to Anxiety. I obtained verbal consent from Patient.  Visit completed with Denise Lopez  on the phone  Background:   Past Medical History:  Diagnosis Date   Allergy    Anxiety    Asthma    Seizures (HCC)    Syncope and collapse     Assessment: Patient Reported Symptoms:  Cognitive Cognitive Status: No symptoms reported, Insightful and able to interpret abstract concepts, Alert and oriented to person, place, and time, Normal speech and language skills      Neurological Neurological Review of Symptoms: No symptoms reported Neurological Comment: has some occasional dizziness with position changes.  Educated on staying hydrated, taking care with position changes.  HEENT HEENT Symptoms Reported: No symptoms reported      Cardiovascular Cardiovascular Symptoms Reported: No symptoms reported    Respiratory Respiratory Symptoms Reported: No symptoms reported    Endocrine Endocrine Symptoms Reported: Not assessed    Gastrointestinal Gastrointestinal Symptoms Reported: No symptoms reported      Genitourinary Genitourinary Symptoms Reported: No symptoms reported    Integumentary Integumentary Symptoms Reported: No symptoms reported    Musculoskeletal Musculoskelatal Symptoms Reviewed: No symptoms reported        Psychosocial Additional Psychological Details: Reports stress due to family situation.          05/02/2024    PHQ2-9 Depression Screening   Little interest or pleasure in doing things Not at all  Feeling down, depressed, or hopeless Not at all  PHQ-2 - Total Score 0  Trouble falling or staying asleep, or sleeping too much    Feeling tired or having little energy    Poor appetite or overeating     Feeling bad about yourself - or that you are a failure or have let yourself or your family down     Trouble concentrating on things, such as reading the newspaper or watching television    Moving or speaking so slowly that other people could have noticed.  Or the opposite - being so fidgety or restless that you have been moving around a lot more than usual    Thoughts that you would be better off dead, or hurting yourself in some way    PHQ2-9 Total Score    If you checked off any problems, how difficult have these problems made it for you to do your work, take care of things at home, or get along with other people    Depression Interventions/Treatment      There were no vitals filed for this visit.  Medications Reviewed Today   Medications were not reviewed in this encounter     Recommendation:   Discussed upcoming appts:  Patient has phone appointment with Lenn Mean, LCSW on 05/05/24 PCP follow up appointment 09/12/2024  Follow Up Plan:   Telephone follow-up in 1 month  Santana Stamp BSN, CCM [place]  [facility] Population Health RN Care Manager Direct Dial: [phone]  Fax: [phone]

## 2024-05-02 NOTE — Patient Instructions (Signed)
   Visit Information  Denise Lopez was given information about Medicaid Managed Care team care coordination services as a part of their [employer] Medicaid benefit.   If you would like to schedule transportation through your [employer] plan, please call the following number at least 2 days in advance of your appointment: [phone].   There is no limit to the number of trips during the year between medical appointments, healthcare facilities, or pharmacies. Transportation must be scheduled at least 2 business days before but not more than thirty 30 days before of your appointment.  Call the Behavioral Health Crisis Line at [phone], at any time, 24 hours a day, 7 days a week. If you are in danger or need immediate medical attention call 911.   Denise Lopez - following are the goals we discussed in your visit today:   Goals Addressed             This Visit's Progress    VBCI RN Care Plan   No change    Problems:  Chronic Disease Management support and education needs related to Anxiety  Goal: Over the next 30 days the Patient will work with Child psychotherapist to address Family and relationship dysfunction related to the management of Anxiety as evidenced by review of electronic medical record and patient or social worker report      Interventions:   Evaluation of current treatment plan related to Anxiety,  self-management and patient's adherence to plan as established by provider. Discussed plans with patient for ongoing care management follow up and provided patient with direct contact information for care management team Social Work referral for Counseling services  Patient Self-Care Activities:  Work with the social worker to address care coordination needs and will continue to work with the clinical team to address health care and disease management related needs  Plan:  Telephone follow up appointment with care management team member scheduled for:  one  month.          VBCI RN Care Plan   On track    Problems:  Knowledge Deficits related to Health Maintenance.  Goal: Over the next 30 days the Patient will demonstrate Improved health management independence as evidenced by making appointments for dental and vision         Interventions:   Health Maintenance Interventions: Patient interviewed about adult health maintenance status including  Regular eye checkups Regular Dental Care    Performed conference call with patient and [facility] [employer] so patient can be provided with insurance card, connect with her dental/vision/ and Value-added benefits.   Patient Self-Care Activities:  -Call health insurance to inquire of in-network providers for dental and vision.  Plan:  Telephone follow up appointment with care management team member scheduled for:  one month.              Patient verbalizes understanding of instructions and care plan provided today and agrees to view in MyChart. Active MyChart status and patient understanding of how to access instructions and care plan via MyChart confirmed with patient.     Telephone follow up appointment with Managed Medicaid care management team member scheduled qnm:Qmpijb, October 3rd at 2:00pm with Jackson Acron, RNCM.   Santana Stamp BSN, CCM [place]  VBCI Population Health RN Care Manager Direct Dial: [phone]  Fax: [phone]   Following is a copy of your plan of care:  There are no care plans that you recently modified to display for this patient.

## 2024-05-05 ENCOUNTER — Encounter: Payer: Self-pay | Admitting: *Deleted

## 2024-05-05 ENCOUNTER — Telehealth: Payer: Self-pay | Admitting: *Deleted

## 2024-05-20 ENCOUNTER — Telehealth: Payer: Self-pay | Admitting: *Deleted

## 2024-05-20 ENCOUNTER — Encounter: Payer: Self-pay | Admitting: *Deleted

## 2024-05-20 NOTE — Patient Instructions (Signed)
 Briant DELENA Finder - I am sorry I was unable to reach you today. I work with Towana Small, FNP and am calling to support your healthcare needs. Please contact me at [phone] at your earliest convenience. I look forward to speaking with you soon.   Thank you,  Magdaline Zollars, LCSW [place]  [facility], [facility] Licensed Clinical Social Worker  Direct Dial: [phone]

## 2024-05-30 ENCOUNTER — Telehealth

## 2024-06-09 ENCOUNTER — Encounter: Payer: Self-pay | Admitting: *Deleted

## 2024-06-09 ENCOUNTER — Telehealth: Payer: Self-pay | Admitting: *Deleted

## 2024-06-09 NOTE — Patient Instructions (Signed)
 Briant DELENA Finder - I have attempted to call you three times but have been unsuccessful in reaching you. I work with Towana Small, FNP and am calling to support your healthcare needs. If I can be of assistance to you, please contact me at [phone].     Thank you,    Waunita Sandstrom, LCSW [place]  [facility], [facility] Licensed Clinical Social Worker  Direct Dial: [phone]

## 2024-06-13 ENCOUNTER — Telehealth: Payer: Self-pay

## 2024-06-13 NOTE — Patient Instructions (Signed)
 Denise Lopez - I am sorry I was unable to reach you today for our scheduled appointment. I work with Towana Small, FNP and am calling to support your healthcare needs. Please contact me at [phone] at your earliest convenience. I look forward to speaking with you soon.   Thank you,   Jackson Acron [facility] RN Care Manager Direct Dial: [phone]  Fax: [phone] Website: [email or web address]

## 2024-08-09 NOTE — Progress Notes (Signed)
 Subjective Patient ID: Denise Lopez is a 33 y.o. female.    Denise Lopez is a 33 y.o. female presents to the clinic complaining of left eye irritation and redness that started last night.  She reports she is also having excessive tearing and feels like there may be something in the eye.  Patient reports she initially thought it was due to allergies and has been using OTC allergy eye drops without much relief.  She is allergic to dogs and has recently been around a puppy.  Denies traumatic injury to the eye or face.  She does take antihistamines as needed for allergies.  She did have some photophobia while outside in sunlight.     History provided by:  Patient Language interpreter used: No     Review of Systems  Constitutional:  Negative for chills and fever.  HENT:  Negative for congestion, facial swelling and rhinorrhea.   Eyes:  Positive for photophobia, discharge (watery discharge), redness and itching. Negative for pain and visual disturbance.  Neurological:  Negative for headaches.    Patient History  Allergies: No Known Allergies  History reviewed. No pertinent past medical history. History reviewed. No pertinent surgical history. Social History   Socioeconomic History   Marital status: Single    Spouse name: Not on file   Number of children: Not on file   Years of education: Not on file   Highest education level: Not on file  Occupational History   Not on file  Tobacco Use   Smoking status: Never   Smokeless tobacco: Never  Substance and Sexual Activity   Alcohol use: Not on file   Drug use: Not on file   Sexual activity: Not on file  Other Topics Concern   Not on file  Social History Narrative   Not on file   History reviewed. No pertinent family history. No current outpatient medications on file prior to visit.   No current facility-administered medications on file prior to visit.    Objective  Vitals:   08/09/24 1235  BP: 114/71   Pulse: 72  Resp: 19  Temp: 37.1 C (98.7 F)  TempSrc: Tympanic  SpO2: 98%  Weight: 58.5 kg  Height: 5' 8  PainSc:   5  LMP: 08/02/2024        OBGYN/Pregnancy Status: Having periods      No results found.  Physical Exam Vitals and nursing note reviewed.  Constitutional:      General: She is not in acute distress.    Appearance: Normal appearance. She is not ill-appearing.  HENT:     Head: Normocephalic.     Nose: Congestion and rhinorrhea present. Rhinorrhea is clear.     Mouth/Throat:     Lips: Pink.     Mouth: Mucous membranes are moist.  Eyes:     General: Lids are normal. Vision grossly intact.     Extraocular Movements: Extraocular movements intact.     Conjunctiva/sclera:     Right eye: Right conjunctiva is not injected.     Left eye: Left conjunctiva is injected. No exudate.    Pupils: Pupils are equal, round, and reactive to light.     Left eye: Corneal abrasion and fluorescein uptake present.      Comments: Excessive tearing of the left eye.  No mucopurulent discharge.  Corneal abrasion to left eye without ulceration.  No foreign bodies appreciated.     Cardiovascular:     Rate and Rhythm: Normal rate and regular rhythm.  Pulmonary:     Effort: Pulmonary effort is normal.  Musculoskeletal:     Cervical back: Full passive range of motion without pain.  Skin:    General: Skin is warm and dry.     Capillary Refill: Capillary refill takes less than 2 seconds.     Findings: Rash present. Rash is macular and papular.         Comments: Maculopapular rash to left cheek and forehead.   Neurological:     Mental Status: She is alert.     No results found for this visit on 08/09/24.     Procedures MDM:     1 Acute, uncomplicated illness or injury     1+ Chronic illness with exacerbation, progression or side effects of treatment     Explanation of Medical Decision Making and variances from expected care:    Corneal abrasion found on exam.  RX  Polytrim.  Patient is not a contact lens wearer.   Suspect patient has allergic reaction to dogs given rash, rhinitis, and left eye redness.   RX olopatadine eye drops for allergy symptoms.  Discussed supportive care and OTC medications for symptomatic management. Recheck if worsening or not improving.  Recommended ophthalmology follow up.  Return precautions given.    Assessment requiring historian other than patient: No     Independent visualization of image, tracing, or test: No     Discussion of management with another provider: No     Risk:: Moderate            Assessment/Plan Diagnoses and all orders for this visit:  Cornea abrasion, left, initial encounter -     trimethoprim-polymyxin b (Polytrim) ophthalmic solution; Administer 1 drop into both eyes in the morning and 1 drop at noon and 1 drop in the evening and 1 drop before bedtime. Do all this for 5 days.  Irritation of left eye -     tetracaine (Altacaine) 0.5 % ophthalmic solution 2 drop  Allergic conjunctivitis and rhinitis, left -     olopatadine (Pataday) 0.2 % ophthalmic solution; Administer 1 drop into both eyes 1 (one) time each day.     Disposition Status: Home  Patient Instructions  Corneal abrasions can be caused by a number of objects including fingernails, contact lens wear, plant branches, and foreign body object blown or thrown into eyes.   Symptoms of corneal abrasion include but not limited to eye pain exacerbated by eye movement, photophobia, foreign body sensation (gritty sensation), excessive watery eye tearing, blurred vision, headache, blepharospasm.   Start topical eye antibiotic ointment or drops as directed. This is to avoid corneal abrasion turing into an ulcer or infection.  If you wear contact lenses do not wear contact lenses until fully healed.  Avoid patches on the affected eye if having sensitivity to light symptoms as this can delay healing, promote bacterial growth, exacerbate the  eye pain. Wear sunglasses as alternative.  For pain relief, patient can take Ibuprofen as directed per labeling instructions. Read warning labels.  Small uncomplicated corneal abrasions typically heal within 3-4 days. Larger corneal abrasions typically take 5 days to heal.  If any inadequate healing or no improvement in symptoms in the next 72 hours of starting topical eye antibiotic, it is recommended you follow up with any eye doctor for further evaluation and management of your condition.    Progress note signed by Gerard Gaskins, PA on 08/09/24 at  1:26 PM

## 2024-09-11 ENCOUNTER — Other Ambulatory Visit: Payer: Self-pay | Admitting: Family Medicine

## 2024-09-11 DIAGNOSIS — E0590 Thyrotoxicosis, unspecified without thyrotoxic crisis or storm: Secondary | ICD-10-CM

## 2024-09-12 ENCOUNTER — Ambulatory Visit: Payer: Self-pay
